# Patient Record
Sex: Female | Born: 1960 | Race: White | Hispanic: No | Marital: Married | Smoking: Former smoker
Health system: Southern US, Community
[De-identification: ages and names within clinical notes are randomized; demographics above are authoritative.]

## PROBLEM LIST (undated history)

## (undated) DIAGNOSIS — I10 Essential (primary) hypertension: Secondary | ICD-10-CM

## (undated) DIAGNOSIS — F419 Anxiety disorder, unspecified: Secondary | ICD-10-CM

## (undated) DIAGNOSIS — E785 Hyperlipidemia, unspecified: Secondary | ICD-10-CM

## (undated) HISTORY — DX: Essential (primary) hypertension: I10

## (undated) HISTORY — PX: TUBAL LIGATION: SHX77

## (undated) HISTORY — DX: Anxiety disorder, unspecified: F41.9

## (undated) HISTORY — DX: Hyperlipidemia, unspecified: E78.5

---

## 2006-09-22 ENCOUNTER — Ambulatory Visit: Payer: Self-pay | Admitting: Gastroenterology

## 2010-12-06 ENCOUNTER — Ambulatory Visit: Payer: Self-pay

## 2011-11-12 ENCOUNTER — Ambulatory Visit: Payer: Self-pay

## 2012-12-12 ENCOUNTER — Ambulatory Visit: Payer: Self-pay | Admitting: Family Medicine

## 2013-06-29 LAB — HM PAP SMEAR: HM Pap smear: NEGATIVE

## 2013-07-01 ENCOUNTER — Ambulatory Visit: Payer: Self-pay

## 2013-07-05 ENCOUNTER — Ambulatory Visit: Payer: Self-pay

## 2014-01-03 ENCOUNTER — Ambulatory Visit: Payer: Self-pay

## 2014-03-27 ENCOUNTER — Ambulatory Visit: Payer: Self-pay

## 2014-04-14 ENCOUNTER — Ambulatory Visit: Payer: Self-pay | Admitting: Family Medicine

## 2014-05-26 ENCOUNTER — Ambulatory Visit: Payer: Self-pay | Admitting: Internal Medicine

## 2014-12-01 ENCOUNTER — Other Ambulatory Visit: Payer: Self-pay | Admitting: Unknown Physician Specialty

## 2014-12-01 DIAGNOSIS — Z1231 Encounter for screening mammogram for malignant neoplasm of breast: Secondary | ICD-10-CM

## 2014-12-05 DIAGNOSIS — R7301 Impaired fasting glucose: Secondary | ICD-10-CM

## 2014-12-05 DIAGNOSIS — E785 Hyperlipidemia, unspecified: Secondary | ICD-10-CM | POA: Insufficient documentation

## 2014-12-05 DIAGNOSIS — I159 Secondary hypertension, unspecified: Secondary | ICD-10-CM

## 2014-12-05 DIAGNOSIS — F419 Anxiety disorder, unspecified: Secondary | ICD-10-CM

## 2014-12-05 DIAGNOSIS — I10 Essential (primary) hypertension: Secondary | ICD-10-CM | POA: Insufficient documentation

## 2014-12-06 ENCOUNTER — Encounter: Payer: Self-pay | Admitting: Unknown Physician Specialty

## 2014-12-06 ENCOUNTER — Ambulatory Visit
Admission: RE | Admit: 2014-12-06 | Discharge: 2014-12-06 | Disposition: A | Discharge status: Home / Self Care | Payer: 59 | Source: Ambulatory Visit | Attending: Unknown Physician Specialty | Admitting: Unknown Physician Specialty

## 2014-12-06 ENCOUNTER — Ambulatory Visit (INDEPENDENT_AMBULATORY_CARE_PROVIDER_SITE_OTHER): Payer: 59 | Admitting: Unknown Physician Specialty

## 2014-12-06 VITALS — BP 139/81 | HR 97 | Temp 98.6°F | Ht 63.5 in | Wt 204.6 lb

## 2014-12-06 DIAGNOSIS — Z1231 Encounter for screening mammogram for malignant neoplasm of breast: Secondary | ICD-10-CM | POA: Insufficient documentation

## 2014-12-06 DIAGNOSIS — I10 Essential (primary) hypertension: Secondary | ICD-10-CM

## 2014-12-06 DIAGNOSIS — E785 Hyperlipidemia, unspecified: Secondary | ICD-10-CM

## 2014-12-06 NOTE — Progress Notes (Signed)
No charge visit as not due to be seen.

## 2014-12-15 ENCOUNTER — Other Ambulatory Visit: Payer: Self-pay | Admitting: Unknown Physician Specialty

## 2014-12-21 ENCOUNTER — Ambulatory Visit
Admission: EM | Admit: 2014-12-21 | Discharge: 2014-12-21 | Disposition: A | Discharge status: Home / Self Care | Payer: 59 | Attending: Internal Medicine | Admitting: Internal Medicine

## 2014-12-21 ENCOUNTER — Encounter: Payer: Self-pay | Admitting: Emergency Medicine

## 2014-12-21 DIAGNOSIS — J40 Bronchitis, not specified as acute or chronic: Secondary | ICD-10-CM

## 2014-12-21 DIAGNOSIS — J019 Acute sinusitis, unspecified: Secondary | ICD-10-CM

## 2014-12-21 MED ORDER — TRIAMCINOLONE ACETONIDE 55 MCG/ACT NA AERO: 2.0000 | INHALATION_SPRAY | Freq: Every day | NASAL | Status: DC

## 2014-12-21 MED ORDER — AZITHROMYCIN 250 MG PO TABS
250.0000 mg | ORAL_TABLET | Freq: Every day | ORAL | Status: DC
Start: 2014-12-21 — End: 2015-04-13

## 2014-12-21 NOTE — Discharge Instructions (Signed)
Prescriptions for zithromax and nasacort were sent to the [facility] in [place]. Recheck or followup pcp/Cheryl Wicker NP if not improving in several days, and for new fever >100.5, increasing phlegm production. Cough may take 2-3 weeks to subside and resolve.

## 2014-12-21 NOTE — ED Notes (Signed)
Patient c/o sinus pain and pressure and nasal congestion, HAs for a week.  Patient denies fevers.

## 2014-12-21 NOTE — ED Provider Notes (Signed)
CSN: [id number]     Arrival date & time 12/21/14  1345 History   First MD Initiated Contact with Patient 12/21/14 1506     Chief Complaint  Patient presents with  . Nasal Congestion  . Facial Pain   HPI Patient is a 54 year old lady who presents with more than a week's history of sinus congestion, facial pressure, cough. Having difficulty sleeping at night, because of cough and congestion. Intermittent headache. Some runny nose. Initially some sore throat, now resolved. No nausea/vomiting/diarrhea. No fever. Little bit of achiness. No sick contacts. She's been taking Allegra-D, and NyQuil product, both of which make her jittery.   Past Medical History  Diagnosis Date  . Anxiety   . Hyperlipidemia   . Hypertension    Past Surgical History  Procedure Laterality Date  . Tubal ligation     Family History  Problem Relation Age of Onset  . Hypertension Mother   . Cancer Mother     breast and uterine  . Breast cancer Mother 57  . Hyperlipidemia Mother   . Hyperlipidemia Father   . Cancer Father     lung  . Hypertension Brother   . Hyperlipidemia Brother   . Cancer Maternal Grandmother     breast and skin  . Breast cancer Maternal Grandmother 60  . Hyperlipidemia Brother   . Hypertension Brother    History  Substance Use Topics  . Smoking status: Former Research scientist (life sciences)  . Smokeless tobacco: Never Used  . Alcohol Use: Yes     Comment: pt states 5 to 6 mixed drinks per week   OB History    No data available     Review of Systems  All other systems reviewed and are negative.   Allergies  Review of patient's allergies indicates no known allergies.  Home Medications   Prior to Admission medications   Medication Sig Start Date End Date Taking? Authorizing Provider  atorvastatin (LIPITOR) 20 MG tablet Take 20 mg by mouth daily.    Historical Provider, MD  azithromycin (ZITHROMAX) 250 MG tablet Take 1 tablet (250 mg total) by mouth daily. Take first 2 tablets together, then 1  every day until finished. 12/21/14   Sherlene Shams, MD  losartan (COZAAR) 50 MG tablet Take 1 tablet by mouth  daily 12/15/14   Valerie Roys, DO  triamcinolone (NASACORT AQ) 55 MCG/ACT AERO nasal inhaler Place 2 sprays into the nose daily. 12/21/14   Sherlene Shams, MD   BP 124/52 mmHg  Pulse 104  Temp(Src) 98.2 F (36.8 C) (Tympanic)  Resp 16  Ht 5\' 3"  (1.6 m)  Wt 200 lb (90.719 kg)  BMI 35.44 kg/m2  SpO2 97%  LMP  (LMP Unknown) Physical Exam  Constitutional: She is oriented to person, place, and time.  Alert, nicely groomed Looks tired. Leaning against the wall.  HENT:  Head: Atraumatic.  Bilateral TMs are moderately dull, no erythema Marked nasal congestion Throat is slightly red, with postnasal drainage  Eyes:  Conjugate gaze, no eye redness/drainage  Neck: Neck supple.  Cardiovascular: Normal rate and regular rhythm.   Pulmonary/Chest: No respiratory distress. She has no rales.  Coarse breath sounds throughout, symmetric breath sounds Deep breathing causes cough   Abdominal: She exhibits no distension.  Musculoskeletal: Normal range of motion.  No leg swelling  Neurological: She is alert and oriented to person, place, and time.  Skin: Skin is warm and dry.  No cyanosis  Nursing note and vitals reviewed.   ED  Course  Procedures   MDM   1. Acute sinusitis, recurrence not specified, unspecified location   2. Bronchitis    rx zithromax, nasacort.  Recheck or followup pcp/Cheryl Wicker for persistent/worsening sx's.    Sherlene Shams, MD 12/22/14 [phone]

## 2015-01-24 ENCOUNTER — Other Ambulatory Visit: Payer: Self-pay | Admitting: Unknown Physician Specialty

## 2015-03-27 ENCOUNTER — Ambulatory Visit: Payer: 59 | Admitting: Unknown Physician Specialty

## 2015-04-13 ENCOUNTER — Ambulatory Visit (INDEPENDENT_AMBULATORY_CARE_PROVIDER_SITE_OTHER): Payer: 59 | Admitting: Unknown Physician Specialty

## 2015-04-13 ENCOUNTER — Encounter: Payer: Self-pay | Admitting: Unknown Physician Specialty

## 2015-04-13 VITALS — BP 126/84 | HR 81 | Temp 97.9°F | Ht 63.5 in | Wt 208.0 lb

## 2015-04-13 DIAGNOSIS — R7301 Impaired fasting glucose: Secondary | ICD-10-CM

## 2015-04-13 DIAGNOSIS — E785 Hyperlipidemia, unspecified: Secondary | ICD-10-CM

## 2015-04-13 DIAGNOSIS — I159 Secondary hypertension, unspecified: Secondary | ICD-10-CM

## 2015-04-13 DIAGNOSIS — Z0000 Encounter for general adult medical examination without abnormal findings: Secondary | ICD-10-CM

## 2015-04-13 LAB — LIPID PANEL PICCOLO, WAIVED
CHOL/HDL RATIO PICCOLO,WAIVE: 3.8 mg/dL
CHOLESTEROL PICCOLO, WAIVED: 144 mg/dL (ref <200)
HDL CHOL PICCOLO, WAIVED: 38 mg/dL — AB (ref >59)
LDL Chol Calc Piccolo Waived: 75 mg/dL (ref <100)
TRIGLYCERIDES PICCOLO,WAIVED: 160 mg/dL — AB (ref <150)
VLDL Chol Calc Piccolo,Waive: 32 mg/dL — ABNORMAL HIGH (ref <30)

## 2015-04-13 LAB — MICROALBUMIN, URINE WAIVED
CREATININE, URINE WAIVED: 200 mg/dL (ref 10–300)
MICROALB, UR WAIVED: 30 mg/L — AB (ref 0–19)
Microalb/Creat Ratio: <30 mg/g (ref <30)

## 2015-04-13 MED ORDER — LOSARTAN POTASSIUM 50 MG PO TABS: 50.0000 mg | ORAL_TABLET | Freq: Every day | ORAL | Status: DC

## 2015-04-13 MED ORDER — ATORVASTATIN CALCIUM 20 MG PO TABS: 20.0000 mg | ORAL_TABLET | Freq: Every day | ORAL | Status: DC

## 2015-04-13 NOTE — Assessment & Plan Note (Signed)
Stable, continue present medications.   

## 2015-04-13 NOTE — Assessment & Plan Note (Signed)
.  Lipid panel reviewed, LDL is 76,.  Continue present meds

## 2015-04-13 NOTE — Progress Notes (Signed)
BP 126/84 mmHg  Pulse 81  Temp(Src) 97.9 F (36.6 C)  Ht 5' 3.5" (1.613 m)  Wt 208 lb (94.348 kg)  BMI 36.26 kg/m2  SpO2 94%  LMP  (LMP Unknown)   Subjective:    Patient ID: Lorraine Melendez, female    DOB: 08-19-1960, 54 y.o.   MRN: [id number]  HPI: Lorraine Melendez is a 54 y.o. female  Chief Complaint  Patient presents with  . Hyperlipidemia  . Hypertension   Hypertension Using medications without difficulty Average home BPs   No problems or lightheadedness No chest pain with exertion or shortness of breath No Edema   Hyperlipidemia Using medications without problems: No Muscle aches: none Diet compliance: Exercise:    Relevant past medical, surgical, family and social history reviewed and updated as indicated. Interim medical history since our last visit reviewed. Allergies and medications reviewed and updated.  Review of Systems  Per HPI unless specifically indicated above     Objective:    BP 126/84 mmHg  Pulse 81  Temp(Src) 97.9 F (36.6 C)  Ht 5' 3.5" (1.613 m)  Wt 208 lb (94.348 kg)  BMI 36.26 kg/m2  SpO2 94%  LMP  (LMP Unknown)  Wt Readings from Last 3 Encounters:  04/13/15 208 lb (94.348 kg)  12/21/14 200 lb (90.719 kg)  12/06/14 204 lb 9.6 oz (92.806 kg)    Physical Exam  Constitutional: She is oriented to person, place, and time. She appears well-developed and well-nourished. No distress.  HENT:  Head: Normocephalic and atraumatic.  Eyes: Conjunctivae and lids are normal. Right eye exhibits no discharge. Left eye exhibits no discharge. No scleral icterus.  Cardiovascular: Normal rate, regular rhythm and normal heart sounds.   Pulmonary/Chest: Effort normal and breath sounds normal. No respiratory distress.  Abdominal: Normal appearance. There is no splenomegaly or hepatomegaly.  Musculoskeletal: Normal range of motion.  Neurological: She is alert and oriented to person, place, and time.  Skin: Skin is intact. No rash noted. No  pallor.  Psychiatric: She has a normal mood and affect. Her behavior is normal. Judgment and thought content normal.     Assessment & Plan:   Problem List Items Addressed This Visit      Unprioritized   Impaired fasting glucose    States labs done at [facility] showed Hgb A1C of less than 5      Hyperlipidemia - Primary    .Lipid panel reviewed, LDL is 76,.  Continue present meds      Relevant Medications   atorvastatin (LIPITOR) 20 MG tablet   losartan (COZAAR) 50 MG tablet   Other Relevant Orders   Microalbumin, Urine Waived   Uric acid   Lipid Panel Piccolo, Waived   Comprehensive metabolic panel   Hypertension    Stable, continue present medications.       Relevant Medications   atorvastatin (LIPITOR) 20 MG tablet   losartan (COZAAR) 50 MG tablet   Other Relevant Orders   Microalbumin, Urine Waived   Uric acid   Lipid Panel Piccolo, Waived   Comprehensive metabolic panel    Other Visit Diagnoses    Health care maintenance        Relevant Orders    Microalbumin, Urine Waived    Uric acid    Lipid Panel Piccolo, Waived    Comprehensive metabolic panel    HIV antibody    Hepatitis C antibody        Follow up plan: Return in about 6 months (  around 10/11/2015) for physical.

## 2015-04-13 NOTE — Assessment & Plan Note (Signed)
States labs done at [facility] showed Hgb A1C of less than 5

## 2015-04-14 LAB — COMPREHENSIVE METABOLIC PANEL
ALK PHOS: 80 IU/L (ref 39–117)
ALT: 35 IU/L — ABNORMAL HIGH (ref 0–32)
AST: 22 IU/L (ref 0–40)
Albumin/Globulin Ratio: 1.9 (ref 1.1–2.5)
Albumin: 4.1 g/dL (ref 3.5–5.5)
BUN/Creatinine Ratio: 18 (ref 9–23)
BUN: 13 mg/dL (ref 6–24)
Bilirubin Total: 0.5 mg/dL (ref 0.0–1.2)
CO2: 24 mmol/L (ref 18–29)
Calcium: 9 mg/dL (ref 8.7–10.2)
Chloride: 101 mmol/L (ref 97–106)
Creatinine, Ser: 0.72 mg/dL (ref 0.57–1.00)
GFR calc Af Amer: 109 mL/min/{1.73_m2} (ref >59)
GFR calc non Af Amer: 95 mL/min/{1.73_m2} (ref >59)
GLOBULIN, TOTAL: 2.2 g/dL (ref 1.5–4.5)
Glucose: 97 mg/dL (ref 65–99)
POTASSIUM: 4.4 mmol/L (ref 3.5–5.2)
SODIUM: 143 mmol/L (ref 136–144)
Total Protein: 6.3 g/dL (ref 6.0–8.5)

## 2015-04-14 LAB — HIV ANTIBODY (ROUTINE TESTING W REFLEX): HIV SCREEN 4TH GENERATION: NONREACTIVE

## 2015-04-14 LAB — URIC ACID: Uric Acid: 5 mg/dL (ref 2.5–7.1)

## 2015-04-14 LAB — HEPATITIS C ANTIBODY

## 2015-04-16 ENCOUNTER — Encounter: Payer: Self-pay | Admitting: Unknown Physician Specialty

## 2015-04-16 NOTE — Progress Notes (Signed)
Quick Note:  Normal labs. Patient notified by letter. ______ 

## 2015-08-21 ENCOUNTER — Encounter: Payer: 59 | Admitting: Unknown Physician Specialty

## 2015-10-03 ENCOUNTER — Ambulatory Visit (INDEPENDENT_AMBULATORY_CARE_PROVIDER_SITE_OTHER): Payer: 59 | Admitting: Unknown Physician Specialty

## 2015-10-03 ENCOUNTER — Encounter: Payer: Self-pay | Admitting: Unknown Physician Specialty

## 2015-10-03 VITALS — BP 115/78 | HR 73 | Temp 97.7°F | Ht 63.2 in | Wt 199.6 lb

## 2015-10-03 DIAGNOSIS — Z0000 Encounter for general adult medical examination without abnormal findings: Secondary | ICD-10-CM

## 2015-10-03 DIAGNOSIS — E785 Hyperlipidemia, unspecified: Secondary | ICD-10-CM

## 2015-10-03 DIAGNOSIS — I10 Essential (primary) hypertension: Secondary | ICD-10-CM

## 2015-10-03 LAB — BAYER DCA HB A1C WAIVED: HB A1C: 5.7 % (ref <7.0)

## 2015-10-03 LAB — LIPID PANEL PICCOLO, WAIVED
CHOL/HDL RATIO PICCOLO,WAIVE: 4.7 mg/dL
CHOLESTEROL PICCOLO, WAIVED: 138 mg/dL (ref <200)
HDL CHOL PICCOLO, WAIVED: 29 mg/dL — AB (ref >59)
LDL CHOL CALC PICCOLO WAIVED: 77 mg/dL (ref <100)
TRIGLYCERIDES PICCOLO,WAIVED: 159 mg/dL — AB (ref <150)
VLDL CHOL CALC PICCOLO,WAIVE: 32 mg/dL — AB (ref <30)

## 2015-10-03 MED ORDER — ATORVASTATIN CALCIUM 20 MG PO TABS: 20.0000 mg | ORAL_TABLET | Freq: Every day | ORAL | Status: DC

## 2015-10-03 MED ORDER — LOSARTAN POTASSIUM 50 MG PO TABS: 50.0000 mg | ORAL_TABLET | Freq: Every day | ORAL | Status: DC

## 2015-10-03 NOTE — Patient Instructions (Signed)
Please do call to schedule your mammogram; the number to schedule one at either [facility] or [facility] is [phone]   

## 2015-10-03 NOTE — Assessment & Plan Note (Signed)
Stable, continue present medications.   

## 2015-10-03 NOTE — Assessment & Plan Note (Signed)
Lipid panel ordered. Results pending.

## 2015-10-03 NOTE — Progress Notes (Signed)
BP 115/78 mmHg  Pulse 73  Temp(Src) 97.7 F (36.5 C)  Ht 5' 3.2" (1.605 m)  Wt 199 lb 9.6 oz (90.538 kg)  BMI 35.15 kg/m2  SpO2 96%  LMP  (LMP Unknown)   Subjective:    Patient ID: Lorraine Melendez, female    DOB: [birth year], 55 y.o.   MRN: [id number]  HPI: Lorraine Melendez is a 55 y.o. female  Chief Complaint  Patient presents with  . Annual Exam    Relevant past medical, surgical, family and social history reviewed and updated as indicated. Interim medical history since our last visit reviewed. Allergies and medications reviewed and updated.  Hypertension Using medications without difficulty: may miss a dose once or twice a month Average home BPs: doesn't take No problems or lightheadedness No chest pain with exertion or shortness of breath No Edema  Hyperlipidemia Using medications without problems: may miss a dose once or twice a month No Muscle aches  Diet compliance: mostly eats at home. Goes through "peaks and valleys" with eating healthy. Tries to eat a lot of salads, vegetables, and fruits Exercise: Walks at work   Review of Systems  Constitutional: Negative.   HENT: Negative.   Eyes: Negative.   Respiratory: Negative.   Cardiovascular: Negative.   Gastrointestinal: Negative.   Endocrine: Negative.   Genitourinary: Negative.   Musculoskeletal: Negative.   Skin: Negative.   Allergic/Immunologic: Negative.   Neurological: Negative.   Hematological: Negative.   Psychiatric/Behavioral: Negative.     Per HPI unless specifically indicated above     Objective:    BP 115/78 mmHg  Pulse 73  Temp(Src) 97.7 F (36.5 C)  Ht 5' 3.2" (1.605 m)  Wt 199 lb 9.6 oz (90.538 kg)  BMI 35.15 kg/m2  SpO2 96%  LMP  (LMP Unknown)  Wt Readings from Last 3 Encounters:  10/03/15 199 lb 9.6 oz (90.538 kg)  04/13/15 208 lb (94.348 kg)  12/21/14 200 lb (90.719 kg)    Physical Exam  Constitutional: She is oriented to person, place, and time. She appears  well-developed and well-nourished.  HENT:  Head: Normocephalic and atraumatic.  Eyes: Pupils are equal, round, and reactive to light. Right eye exhibits no discharge. Left eye exhibits no discharge. No scleral icterus.  Neck: Normal range of motion. Neck supple. Carotid bruit is not present. No thyromegaly present.  Cardiovascular: Normal rate, regular rhythm and normal heart sounds.  Exam reveals no gallop and no friction rub.   No murmur heard. Pulmonary/Chest: Effort normal and breath sounds normal. No respiratory distress. She has no wheezes. She has no rales.  Abdominal: Soft. Bowel sounds are normal. There is no tenderness. There is no rebound.  Genitourinary: No breast swelling, tenderness or discharge.  Musculoskeletal: Normal range of motion.  Lymphadenopathy:    She has no cervical adenopathy.  Neurological: She is alert and oriented to person, place, and time.  Skin: Skin is warm, dry and intact. No rash noted.  Psychiatric: She has a normal mood and affect. Her speech is normal and behavior is normal. Judgment and thought content normal. Cognition and memory are normal.    Results for orders placed or performed in visit on 09/27/15  HM PAP SMEAR  Result Value Ref Range   HM Pap smear from [facility]- negative PAP and negative HPV       Assessment & Plan:   Problem List Items Addressed This Visit      Unprioritized   Hyperlipidemia    Lipid  panel ordered. Results pending.       Relevant Medications   atorvastatin (LIPITOR) 20 MG tablet   losartan (COZAAR) 50 MG tablet   Other Relevant Orders   Lipid Panel Piccolo, Waived   Hypertension    Stable, continue present medications.        Relevant Medications   atorvastatin (LIPITOR) 20 MG tablet   losartan (COZAAR) 50 MG tablet   Other Relevant Orders   Comprehensive metabolic panel    Other Visit Diagnoses    Annual physical exam    -  Primary    Relevant Orders    Bayer DCA Hb A1c Waived    TSH    CBC    MM  DIGITAL SCREENING BILATERAL        Follow up plan: Return in about 6 months (around 04/03/2016).

## 2015-10-04 LAB — COMPREHENSIVE METABOLIC PANEL
ALBUMIN: 4.4 g/dL (ref 3.5–5.5)
ALT: 34 IU/L — ABNORMAL HIGH (ref 0–32)
AST: 20 IU/L (ref 0–40)
Albumin/Globulin Ratio: 1.8 (ref 1.2–2.2)
Alkaline Phosphatase: 86 IU/L (ref 39–117)
BUN / CREAT RATIO: 14 (ref 9–23)
BUN: 11 mg/dL (ref 6–24)
Bilirubin Total: 0.5 mg/dL (ref 0.0–1.2)
CO2: 22 mmol/L (ref 18–29)
CREATININE: 0.77 mg/dL (ref 0.57–1.00)
Calcium: 9.3 mg/dL (ref 8.7–10.2)
Chloride: 101 mmol/L (ref 96–106)
GFR, EST AFRICAN AMERICAN: 101 mL/min/{1.73_m2} (ref >59)
GFR, EST NON AFRICAN AMERICAN: 87 mL/min/{1.73_m2} (ref >59)
GLUCOSE: 91 mg/dL (ref 65–99)
Globulin, Total: 2.4 g/dL (ref 1.5–4.5)
Potassium: 4.4 mmol/L (ref 3.5–5.2)
Sodium: 142 mmol/L (ref 134–144)
TOTAL PROTEIN: 6.8 g/dL (ref 6.0–8.5)

## 2015-10-04 LAB — CBC
HEMATOCRIT: 41.3 % (ref 34.0–46.6)
HEMOGLOBIN: 13.9 g/dL (ref 11.1–15.9)
MCH: 30.3 pg (ref 26.6–33.0)
MCHC: 33.7 g/dL (ref 31.5–35.7)
MCV: 90 fL (ref 79–97)
Platelets: 260 10*3/uL (ref 150–379)
RBC: 4.59 x10E6/uL (ref 3.77–5.28)
RDW: 13.2 % (ref 12.3–15.4)
WBC: 5.8 10*3/uL (ref 3.4–10.8)

## 2015-10-04 LAB — TSH: TSH: 2.68 u[IU]/mL (ref 0.450–4.500)

## 2015-10-05 ENCOUNTER — Encounter: Payer: Self-pay | Admitting: Unknown Physician Specialty

## 2015-10-05 NOTE — Progress Notes (Signed)
Quick Note:  Normal labs. Patient notified by letter. ______ 

## 2015-10-11 ENCOUNTER — Ambulatory Visit
Admission: EM | Admit: 2015-10-11 | Discharge: 2015-10-11 | Disposition: A | Discharge status: Home / Self Care | Payer: 59 | Attending: Family Medicine | Admitting: Family Medicine

## 2015-10-11 ENCOUNTER — Encounter: Payer: Self-pay | Admitting: Emergency Medicine

## 2015-10-11 DIAGNOSIS — R42 Dizziness and giddiness: Secondary | ICD-10-CM

## 2015-10-11 MED ORDER — ONDANSETRON 8 MG PO TBDP: 8.0000 mg | ORAL_TABLET | Freq: Three times a day (TID) | ORAL | Status: DC | PRN

## 2015-10-11 MED ORDER — MECLIZINE HCL 25 MG PO TABS: 25.0000 mg | ORAL_TABLET | Freq: Three times a day (TID) | ORAL | Status: DC | PRN

## 2015-10-11 NOTE — Discharge Instructions (Signed)
Dizziness Dizziness is a common problem. It makes you feel unsteady or lightheaded. You may feel like you are about to pass out (faint). Dizziness can lead to injury if you stumble or fall. Anyone can get dizzy, but dizziness is more common in older adults. This condition can be caused by a number of things, including:  Medicines.  Dehydration.  Illness. HOME CARE Following these instructions may help with your condition: Eating and Drinking  Drink enough fluid to keep your pee (urine) clear or pale yellow. This helps to keep you from getting dehydrated. Try to drink more clear fluids, such as water.  Do not drink alcohol.  Limit how much caffeine you drink or eat if told by your doctor.  Limit how much salt you drink or eat if told by your doctor. Activity  Avoid making quick movements.  When you stand up from sitting in a chair, steady yourself until you feel okay.  In the morning, first sit up on the side of the bed. When you feel okay, stand slowly while you hold onto something. Do this until you know that your balance is fine.  Move your legs often if you need to stand in one place for a long time. Tighten and relax your muscles in your legs while you are standing.  Do not drive or use heavy machinery if you feel dizzy.  Avoid bending down if you feel dizzy. Place items in your home so that they are easy for you to reach without leaning over. Lifestyle  Do not use any tobacco products, including cigarettes, chewing tobacco, or electronic cigarettes. If you need help quitting, ask your doctor.  Try to lower your stress level, such as with yoga or meditation. Talk with your doctor if you need help. General Instructions  Watch your dizziness for any changes.  Take medicines only as told by your doctor. Talk with your doctor if you think that your dizziness is caused by a medicine that you are taking.  Tell a friend or a family member that you are feeling dizzy. If he or  she notices any changes in your behavior, have this person call your doctor.  Keep all follow-up visits as told by your doctor. This is important. GET HELP IF:  Your dizziness does not go away.  Your dizziness or light-headedness gets worse.  You feel sick to your stomach (nauseous).  You have trouble hearing.  You have new symptoms.  You are unsteady on your feet or you feel like the room is spinning. GET HELP RIGHT AWAY IF:  You throw up (vomit) or have diarrhea and are unable to eat or drink anything.  You have trouble:  Talking.  Walking.  Swallowing.  Using your arms, hands, or legs.  You feel generally weak.  You are not thinking clearly or you have trouble forming sentences. It may take a friend or family member to notice this.  You have:  Chest pain.  Pain in your belly (abdomen).  Shortness of breath.  Sweating.  Your vision changes.  You are bleeding.  You have a headache.  You have neck pain or a stiff neck.  You have a fever.   This information is not intended to replace advice given to you by your health care provider. Make sure you discuss any questions you have with your health care provider.   Document Released: 05/15/2011 Document Revised: 10/10/2014 Document Reviewed: 05/22/2014 Elsevier Interactive Patient Education 2016 Else[employer]  Vertigo Vertigo means that  you feel like you are moving when you are not. Vertigo can also make you feel like things around you are moving when they are not. This feeling can come and go at any time. Vertigo often goes away on its own. HOME CARE  Avoid making fast movements.  Avoid driving.  Avoid using heavy machinery.  Avoid doing any task or activity that might cause danger to you or other people if you would have a vertigo attack while you are doing it.  Sit down right away if you feel dizzy or have trouble with your balance.  Take over-the-counter and prescription medicines only as told  by your doctor.  Follow instructions from your doctor about which positions or movements you should avoid.  Drink enough fluid to keep your pee (urine) clear or pale yellow.  Keep all follow-up visits as told by your doctor. This is important. GET HELP IF:  Medicine does not help your vertigo.  You have a fever.  Your problems get worse or you have new symptoms.  Your family or friends see changes in your behavior.  You feel sick to your stomach (nauseous) or you throw up (vomit).  You have a "pins and needles" feeling or you are numb in part of your body. GET HELP RIGHT AWAY IF:  You have trouble moving or talking.  You are always dizzy.  You pass out (faint).  You get very bad headaches.  You feel weak or have trouble using your hands, arms, or legs.  You have changes in your hearing.  You have changes in your seeing (vision).  You get a stiff neck.  Bright light starts to bother you.   This information is not intended to replace advice given to you by your health care provider. Make sure you discuss any questions you have with your health care provider.   Document Released: 03/04/2008 Document Revised: 02/14/2015 Document Reviewed: 09/18/2014 Elsevier Interactive Patient Education [employer].

## 2015-10-11 NOTE — ED Provider Notes (Signed)
CSN: [id number]     Arrival date & time 10/11/15  1315 History   First MD Initiated Contact with Patient 10/11/15 1413    Nurses notes were reviewed. Chief Complaint  Patient presents with  . Dizziness   Patient is here because of dizziness. She states about 3:00 this morning she woke up with the bathroom and she is going to the bathroom became lightheaded and dizzy. The dizziness occurs mostly when she is standing and when she first gets up. Sometimes she will have dizziness almost at times she is fine if she sitting and laying down such as doesn't move suddenly. She doesn't have any problems with tilting her head or moving her head causing dizziness.  Supports last surgery was tubal ligation about 20 years ago she has hypertension hyperlipidemia and history of anxiety disorder. She's had yearly exam recently in the last 2-3 weeks and extensive blood work which she states all was normal. Mother had hypertension and had breast and uterine cancer. Father had hyperlipidemia as well as her brother and mother also had hypertension. She is a former smoker.   (Consider location/radiation/quality/duration/timing/severity/associated sxs/prior Treatment) Patient is a 55 y.o. female presenting with dizziness. The history is provided by the patient. No language interpreter was used.  Dizziness Quality:  Vertigo Severity:  Moderate Onset quality:  Sudden Duration:  12 hours Timing:  Constant Progression:  Partially resolved Chronicity:  New Context: medication and standing up   Context: not when bending over, not with bowel movement, not with ear pain, not with head movement, not with inactivity and not with loss of consciousness   Relieved by:  Nothing Ineffective treatments:  None tried Associated symptoms: no chest pain, no headaches, no hearing loss, no nausea, no palpitations, no shortness of breath, no tinnitus, no vision changes, no vomiting and no weakness   Risk factors: no anemia, no heart  disease, no hx of stroke, no hx of vertigo, no Meniere's disease, no multiple medications and no new medications     Past Medical History  Diagnosis Date  . Anxiety   . Hyperlipidemia   . Hypertension    Past Surgical History  Procedure Laterality Date  . Tubal ligation     Family History  Problem Relation Age of Onset  . Hypertension Mother   . Cancer Mother     breast and uterine  . Breast cancer Mother 24  . Hyperlipidemia Mother   . Hyperlipidemia Father   . Cancer Father     lung  . Hypertension Brother   . Hyperlipidemia Brother   . Cancer Maternal Grandmother     breast and skin  . Breast cancer Maternal Grandmother 60  . Hyperlipidemia Brother   . Hypertension Brother    Social History  Substance Use Topics  . Smoking status: Former Research scientist (life sciences)  . Smokeless tobacco: Never Used  . Alcohol Use: Yes     Comment: pt states 5 to 6 mixed drinks per week   OB History    No data available     Review of Systems  HENT: Negative for hearing loss and tinnitus.   Respiratory: Negative for shortness of breath.   Cardiovascular: Negative for chest pain and palpitations.  Gastrointestinal: Negative for nausea and vomiting.  Neurological: Positive for dizziness. Negative for weakness and headaches.  All other systems reviewed and are negative.   Allergies  Review of patient's allergies indicates no known allergies.  Home Medications   Prior to Admission medications   Medication Sig  Start Date End Date Taking? Authorizing Provider  atorvastatin (LIPITOR) 20 MG tablet Take 1 tablet (20 mg total) by mouth daily at 6 PM. 10/03/15   Kathrine Haddock, NP  losartan (COZAAR) 50 MG tablet Take 1 tablet (50 mg total) by mouth daily. 10/03/15   Kathrine Haddock, NP  meclizine (ANTIVERT) 25 MG tablet Take 1 tablet (25 mg total) by mouth 3 (three) times daily as needed for dizziness. 10/11/15   Frederich Cha, MD  ondansetron (ZOFRAN ODT) 8 MG disintegrating tablet Take 1 tablet (8 mg total) by  mouth every 8 (eight) hours as needed for nausea or vomiting. 10/11/15   Frederich Cha, MD   Meds Ordered and Administered this Visit  Medications - No data to display  BP 134/71 mmHg  Pulse 90  Temp(Src) 97.5 F (36.4 C) (Oral)  Resp 20  Ht 5\' 3"  (1.6 m)  Wt 198 lb (89.812 kg)  BMI 35.08 kg/m2  SpO2 99%  LMP  (LMP Unknown) Orthostatic VS for the past 24 hrs:  BP- Lying Pulse- Lying BP- Sitting Pulse- Sitting BP- Standing at 0 minutes Pulse- Standing at 0 minutes  10/11/15 1427 123/57 mmHg 93 130/62 mmHg 95 129/73 mmHg 97    Physical Exam  Constitutional: She is oriented to person, place, and time. She appears well-developed and well-nourished.  HENT:  Head: Normocephalic and atraumatic.  Right Ear: External ear normal.  Left Ear: External ear normal.  Mouth/Throat: Oropharynx is clear and moist. No oropharyngeal exudate.  Eyes: Conjunctivae are normal. Pupils are equal, round, and reactive to light.  Neck: Normal range of motion. Neck supple. No thyromegaly present.  Cardiovascular: Normal rate, regular rhythm and normal heart sounds.   Pulmonary/Chest: Effort normal and breath sounds normal.  Musculoskeletal: Normal range of motion.  Lymphadenopathy:    She has no cervical adenopathy.  Neurological: She is alert and oriented to person, place, and time.  Skin: Skin is warm and dry.  Psychiatric: She has a normal mood and affect.  Vitals reviewed.   ED Course  Procedures (including critical care time)  Labs Review Labs Reviewed - No data to display  Imaging Review No results found.   Visual Acuity Review  Right Eye Distance:   Left Eye Distance:   Bilateral Distance:    Right Eye Near:   Left Eye Near:    Bilateral Near:      Patient social signs of orthostatic changes. Attempts to do Epley's maneuver were unsuccessful as far as making a change in her dizziness.   MDM   1. Vertigo    With the negative Apley's maneuver and orthostatic changes will treat  conservatively. Patient does have extensive blood work about 2 weeks ago will not repeat that at this time will treat the vertigo with Antivert 25 mg 1 tablet every 8 hours when necessary basis Zofran 8 mg sublingually every 8 hours when necessary we gave her a work note for today and tomorrow. If problem persist she might need further evaluation at this point time will just treat conservatively since no signs of life-threatening illness and disease present.    Note: This dictation was prepared with Dragon dictation along with smaller phrase technology. Any transcriptional errors that result from this process are unintentional.  Frederich Cha, MD 10/11/15 1455

## 2015-10-11 NOTE — ED Notes (Signed)
Pt presents with steady gait to tx room today c/o dizziness that started last night, worse when she has her eyes closed. Pt denies any chest pain or shortness of breath at this time. No acute distress noted.

## 2016-04-04 ENCOUNTER — Ambulatory Visit (INDEPENDENT_AMBULATORY_CARE_PROVIDER_SITE_OTHER): Payer: 59 | Admitting: Unknown Physician Specialty

## 2016-04-04 ENCOUNTER — Encounter: Payer: Self-pay | Admitting: Unknown Physician Specialty

## 2016-04-04 DIAGNOSIS — N898 Other specified noninflammatory disorders of vagina: Secondary | ICD-10-CM | POA: Insufficient documentation

## 2016-04-04 DIAGNOSIS — I10 Essential (primary) hypertension: Secondary | ICD-10-CM | POA: Diagnosis not present

## 2016-04-04 DIAGNOSIS — L298 Other pruritus: Secondary | ICD-10-CM | POA: Diagnosis not present

## 2016-04-04 MED ORDER — LOSARTAN POTASSIUM 50 MG PO TABS: 50.0000 mg | ORAL_TABLET | Freq: Every day | ORAL | 1 refills | Status: DC

## 2016-04-04 MED ORDER — ATORVASTATIN CALCIUM 20 MG PO TABS: 20.0000 mg | ORAL_TABLET | Freq: Every day | ORAL | 1 refills | Status: DC

## 2016-04-04 MED ORDER — FLUCONAZOLE 150 MG PO TABS: 150.0000 mg | ORAL_TABLET | Freq: Every day | ORAL | 0 refills | Status: DC

## 2016-04-04 NOTE — Progress Notes (Signed)
BP 115/78 (BP Location: Left Arm, Patient Position: Sitting, Cuff Size: Large)   Pulse 71   Temp 97.8 F (36.6 C)   Ht 5' 4.2" (1.631 m) Comment: pt had shoes on  Wt 198 lb (89.8 kg) Comment: pt had shoes on  LMP  (LMP Unknown)   SpO2 95%   BMI 33.78 kg/m    Subjective:    Patient ID: Lorraine Melendez, female    DOB: 08/02/1960, 55 y.o.   MRN: [id number]  HPI: Lorraine Melendez is a 55 y.o. female  Chief Complaint  Patient presents with  . Hyperlipidemia  . Hypertension   Hypertension Using medications without difficulty Average home BPs   No problems or lightheadedness No chest pain with exertion or shortness of breath No Edema   Hyperlipidemia Using medications without problems: No Muscle aches  Diet compliance:none Exercise:none  Yeast: Pt states she has vaginal itching seems like "all the time"     Relevant past medical, surgical, family and social history reviewed and updated as indicated. Interim medical history since our last visit reviewed. Allergies and medications reviewed and updated.  Review of Systems  Per HPI unless specifically indicated above     Objective:    BP 115/78 (BP Location: Left Arm, Patient Position: Sitting, Cuff Size: Large)   Pulse 71   Temp 97.8 F (36.6 C)   Ht 5' 4.2" (1.631 m) Comment: pt had shoes on  Wt 198 lb (89.8 kg) Comment: pt had shoes on  LMP  (LMP Unknown)   SpO2 95%   BMI 33.78 kg/m   Wt Readings from Last 3 Encounters:  04/04/16 198 lb (89.8 kg)  10/11/15 198 lb (89.8 kg)  10/03/15 199 lb 9.6 oz (90.5 kg)    Physical Exam  Constitutional: She is oriented to person, place, and time. She appears well-developed and well-nourished. No distress.  HENT:  Head: Normocephalic and atraumatic.  Eyes: Conjunctivae and lids are normal. Right eye exhibits no discharge. Left eye exhibits no discharge. No scleral icterus.  Neck: Normal range of motion. Neck supple. No JVD present. Carotid bruit is not present.    Cardiovascular: Normal rate, regular rhythm and normal heart sounds.   Pulmonary/Chest: Effort normal and breath sounds normal.  Abdominal: Normal appearance. There is no splenomegaly or hepatomegaly.  Musculoskeletal: Normal range of motion.  Neurological: She is alert and oriented to person, place, and time.  Skin: Skin is warm, dry and intact. No rash noted. No pallor.  Psychiatric: She has a normal mood and affect. Her behavior is normal. Judgment and thought content normal.    Results for orders placed or performed in visit on 10/03/15  Lipid Panel [place], [employer]  Result Value Ref Range   Cholesterol Piccolo, Waived 138 <200 mg/dL   HDL Chol Piccolo, Waived 29 (L) >59 mg/dL   Triglycerides Piccolo,Waived 159 (H) <150 mg/dL   Chol/HDL Ratio Piccolo,Waive 4.7 mg/dL   LDL Chol Calc Piccolo Waived 77 <100 mg/dL   VLDL Chol Calc Piccolo,Waive 32 (H) <30 mg/dL  Bayer DCA Hb A1c Waived  Result Value Ref Range   Bayer DCA Hb A1c Waived 5.7 <7.0 %  Comprehensive metabolic panel  Result Value Ref Range   Glucose 91 65 - 99 mg/dL   BUN 11 6 - 24 mg/dL   Creatinine, Ser 0.77 0.57 - 1.00 mg/dL   GFR calc non Af Amer 87 >59 mL/min/1.73   GFR calc Af Amer 101 >59 mL/min/1.73   BUN/Creatinine Ratio 14  9 - 23   Sodium 142 134 - 144 mmol/L   Potassium 4.4 3.5 - 5.2 mmol/L   Chloride 101 96 - 106 mmol/L   CO2 22 18 - 29 mmol/L   Calcium 9.3 8.7 - 10.2 mg/dL   Total Protein 6.8 6.0 - 8.5 g/dL   Albumin 4.4 3.5 - 5.5 g/dL   Globulin, Total 2.4 1.5 - 4.5 g/dL   Albumin/Globulin Ratio 1.8 1.2 - 2.2   Bilirubin Total 0.5 0.0 - 1.2 mg/dL   Alkaline Phosphatase 86 39 - 117 IU/L   AST 20 0 - 40 IU/L   ALT 34 (H) 0 - 32 IU/L  TSH  Result Value Ref Range   TSH 2.680 0.450 - 4.500 uIU/mL  CBC  Result Value Ref Range   WBC 5.8 3.4 - 10.8 x10E3/uL   RBC 4.59 3.77 - 5.28 x10E6/uL   Hemoglobin 13.9 11.1 - 15.9 g/dL   Hematocrit 41.3 34.0 - 46.6 %   MCV 90 79 - 97 fL   MCH 30.3 26.6 - 33.0  pg   MCHC 33.7 31.5 - 35.7 g/dL   RDW 13.2 12.3 - 15.4 %   Platelets 260 150 - 379 x10E3/uL      Assessment & Plan:   Problem List Items Addressed This Visit      Unprioritized   Hypertension    Stable, continue present medications.        Relevant Medications   atorvastatin (LIPITOR) 20 MG tablet   losartan (COZAAR) 50 MG tablet   Vaginal itching    Pt gets relief from OTC meds.  Will give Diflucan monthly to see if it helps.  Schedule PE for more detailed exam       Other Visit Diagnoses   None.      Follow up plan: Return for physical.

## 2016-04-04 NOTE — Assessment & Plan Note (Signed)
Pt gets relief from OTC meds.  Will give Diflucan monthly to see if it helps.  Schedule PE for more detailed exam

## 2016-04-04 NOTE — Assessment & Plan Note (Signed)
Stable, continue present medications.   

## 2016-07-11 ENCOUNTER — Encounter: Payer: Self-pay | Admitting: Unknown Physician Specialty

## 2016-07-11 ENCOUNTER — Ambulatory Visit (INDEPENDENT_AMBULATORY_CARE_PROVIDER_SITE_OTHER): Payer: 59 | Admitting: Unknown Physician Specialty

## 2016-07-11 VITALS — BP 119/76 | HR 88 | Temp 97.5°F | Wt 199.6 lb

## 2016-07-11 DIAGNOSIS — N904 Leukoplakia of vulva: Secondary | ICD-10-CM

## 2016-07-11 DIAGNOSIS — L298 Other pruritus: Secondary | ICD-10-CM

## 2016-07-11 DIAGNOSIS — N898 Other specified noninflammatory disorders of vagina: Secondary | ICD-10-CM

## 2016-07-11 LAB — WET PREP FOR TRICH, YEAST, CLUE
CLUE CELL EXAM: NEGATIVE
TRICHOMONAS EXAM: NEGATIVE
YEAST EXAM: POSITIVE — AB

## 2016-07-11 MED ORDER — AMITRIPTYLINE HCL 10 MG PO TABS: 10.0000 mg | ORAL_TABLET | Freq: Every day | ORAL | 0 refills | Status: DC

## 2016-07-11 MED ORDER — CLOBETASOL PROPIONATE 0.05 % EX OINT: 1.0000 "application " | TOPICAL_OINTMENT | Freq: Two times a day (BID) | CUTANEOUS | 0 refills | Status: DC

## 2016-07-11 MED ORDER — FLUCONAZOLE 150 MG PO TABS: 150.0000 mg | ORAL_TABLET | Freq: Once | ORAL | 0 refills | Status: AC

## 2016-07-11 MED ORDER — VALACYCLOVIR HCL 500 MG PO TABS: 500.0000 mg | ORAL_TABLET | Freq: Two times a day (BID) | ORAL | 0 refills | Status: DC

## 2016-07-11 NOTE — Assessment & Plan Note (Addendum)
Diffuse whitening of the perineum typical of lichen sclerosus. Wet prep positive for yeast. Prescribed clobetasol ointment and amitriptyline for lichen sclerosus. Instructed patient to take another diflucan tablet for yeast. Valtrex x 10 days also prescribed due to history of genital herpes.

## 2016-07-11 NOTE — Assessment & Plan Note (Signed)
New diagnosis.  Will recheck in 1 months

## 2016-07-11 NOTE — Progress Notes (Signed)
BP 119/76 (BP Location: Left Arm, Cuff Size: Large)   Pulse 88   Temp 97.5 F (36.4 C)   Wt 199 lb 9.6 oz (90.5 kg)   LMP  (LMP Unknown)   SpO2 98%   BMI 34.05 kg/m    Subjective:    Patient ID: Lorraine Melendez, female    DOB: 1961-01-20, 56 y.o.   MRN: [id number]  HPI: Lorraine Melendez is a 56 y.o. female  Chief Complaint  Patient presents with  . Vaginal Itching    pt states she has had vaginal itching for about 4 to 5 months now, states diflucan is not helping and the itching is worse at night    Patient presents to clinic with vaginal itching that started about 6 months ago and has gotten progressively worse. States that she is experiencing itching from the clitoris back towards the anus, which she notes is worse at night. Rates the discomfort as an 8 or 9 out of 10. She has been taking once monthly diflucan tablet since October 2017. She has also tried OTC yeast medication without relief and OTC Vagisil with some relief. She recently purchased an over the counter vaginal health test, which tested negative for yeast. Denied any increase or change in vaginal discharge or smell. States she has not been experiencing any menopausal symptoms aside from some vaginal dryness. Notes she has a personal history of genital herpes, which she has not had any issues with since her 39s and a family history of breast and uterine cancer. Denies any constitutional symptoms, urinary symptoms, genital lesions, pelvic or abdominal pain.    Relevant past medical, surgical, family and social history reviewed and updated as indicated. Interim medical history since our last visit reviewed. Allergies and medications reviewed and updated.  Review of Systems  Constitutional: Negative for chills, fatigue, fever and unexpected weight change.  HENT: Negative.   Gastrointestinal: Negative for abdominal pain, constipation, diarrhea and nausea.  Genitourinary: Negative for decreased urine volume, difficulty  urinating, dyspareunia, dysuria, enuresis, flank pain, frequency, genital sores, hematuria, menstrual problem, pelvic pain, urgency, vaginal bleeding, vaginal discharge and vaginal pain.       Intense vaginal itching from clitoris backward toward the anus.    Per HPI unless specifically indicated above     Objective:    BP 119/76 (BP Location: Left Arm, Cuff Size: Large)   Pulse 88   Temp 97.5 F (36.4 C)   Wt 199 lb 9.6 oz (90.5 kg)   LMP  (LMP Unknown)   SpO2 98%   BMI 34.05 kg/m   Wt Readings from Last 3 Encounters:  07/11/16 199 lb 9.6 oz (90.5 kg)  04/04/16 198 lb (89.8 kg)  10/11/15 198 lb (89.8 kg)    Physical Exam  Constitutional: She is oriented to person, place, and time. She appears well-developed and well-nourished.  HENT:  Head: Normocephalic and atraumatic.  Eyes: Conjunctivae are normal. Right eye exhibits no discharge. Left eye exhibits no discharge. No scleral icterus.  Cardiovascular: Normal rate, regular rhythm and normal heart sounds.   Pulmonary/Chest: Effort normal and breath sounds normal. No respiratory distress.  Genitourinary:  Genitourinary Comments: Diffuse whitening of the perineum.  Abrasions noted from scratching.    Neurological: She is alert and oriented to person, place, and time.  Psychiatric: She has a normal mood and affect. Her behavior is normal. Judgment and thought content normal.  Nursing note and vitals reviewed.     Assessment & Plan:  Problem List Items Addressed This Visit      Musculoskeletal and Integument   Vaginal itching    Diffuse whitening of the perineum typical of lichen sclerosus. Wet prep positive for yeast. Prescribed clobetasol ointment and amitriptyline for lichen sclerosus. Instructed patient to take another diflucan tablet for yeast. Valtrex x 10 days also prescribed due to history of genital herpes.      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  Will recheck in 1 months       Relevant Medications   clobetasol ointment (TEMOVATE) 0.05 %   Other Relevant Orders   WET PREP FOR [place], YEAST, CLUE       Follow up plan: Return in about 4 weeks (around 08/08/2016).

## 2016-08-06 ENCOUNTER — Encounter: Payer: Self-pay | Admitting: Unknown Physician Specialty

## 2016-08-06 ENCOUNTER — Ambulatory Visit (INDEPENDENT_AMBULATORY_CARE_PROVIDER_SITE_OTHER): Payer: 59 | Admitting: Unknown Physician Specialty

## 2016-08-06 DIAGNOSIS — N904 Leukoplakia of vulva: Secondary | ICD-10-CM

## 2016-08-06 MED ORDER — CLOBETASOL PROPIONATE 0.05 % EX OINT: 1.0000 "application " | TOPICAL_OINTMENT | Freq: Two times a day (BID) | CUTANEOUS | 1 refills | Status: DC

## 2016-08-06 MED ORDER — AMITRIPTYLINE HCL 10 MG PO TABS: 10.0000 mg | ORAL_TABLET | Freq: Every day | ORAL | 12 refills | Status: DC

## 2016-08-06 NOTE — Assessment & Plan Note (Signed)
Improving.  Continue present treatment with instructions to taper treatment as tolerated.

## 2016-08-06 NOTE — Progress Notes (Signed)
   BP 137/84 (BP Location: Left Arm, Patient Position: Sitting, Cuff Size: Large)   Pulse 90   Temp 98.2 F (36.8 C)   Wt 197 lb 3.2 oz (89.4 kg)   LMP  (LMP Unknown)   SpO2 97%   BMI 33.64 kg/m    Subjective:    Patient ID: Lorraine Melendez, female    DOB: 09/15/1960, 56 y.o.   MRN: [id number]  HPI: Lorraine Melendez is a 55 y.o. female  Chief Complaint  Patient presents with  . Lichen sclerosus    4 week f/up  . Medication Refill    pt states she needs a refill on clobetasol cream    Pt is here to f/u lichen sclerosis.  She is much better on Clobetasol and Amitriptyline at night.  She would like a refill.  Symptoms aren't completely gone but much reduced.    Relevant past medical, surgical, family and social history reviewed and updated as indicated. Interim medical history since our last visit reviewed. Allergies and medications reviewed and updated.  Review of Systems  Per HPI unless specifically indicated above     Objective:    BP 137/84 (BP Location: Left Arm, Patient Position: Sitting, Cuff Size: Large)   Pulse 90   Temp 98.2 F (36.8 C)   Wt 197 lb 3.2 oz (89.4 kg)   LMP  (LMP Unknown)   SpO2 97%   BMI 33.64 kg/m   Wt Readings from Last 3 Encounters:  08/06/16 197 lb 3.2 oz (89.4 kg)  07/11/16 199 lb 9.6 oz (90.5 kg)  04/04/16 198 lb (89.8 kg)    Physical Exam  Constitutional: She is oriented to person, place, and time. She appears well-developed and well-nourished. No distress.  HENT:  Head: Normocephalic and atraumatic.  Eyes: Conjunctivae and lids are normal. Right eye exhibits no discharge. Left eye exhibits no discharge. No scleral icterus.  Cardiovascular: Normal rate.   Pulmonary/Chest: Effort normal.  Abdominal: Normal appearance. There is no splenomegaly or hepatomegaly.  Genitourinary:  Genitourinary Comments: Mild labial erythema.  No discrete lesions  Musculoskeletal: Normal range of motion.  Neurological: She is alert and oriented  to person, place, and time.  Skin: Skin is intact. No rash noted. No pallor.  Psychiatric: She has a normal mood and affect. Her behavior is normal. Judgment and thought content normal.    Results for orders placed or performed in visit on 07/11/16  WET PREP FOR [place], YEAST, CLUE  Result Value Ref Range   Trichomonas Exam Negative Negative   Yeast Exam Positive (A) Negative   Clue Cell Exam Negative Negative      Assessment & Plan:   Problem List Items Addressed This Visit      Unprioritized   Lichen sclerosus of female genitalia    Improving.  Continue present treatment with instructions to taper treatment as tolerated.       Relevant Medications   clobetasol ointment (TEMOVATE) 0.05 %       Follow up plan: Return for physical.

## 2016-10-03 ENCOUNTER — Encounter: Payer: 59 | Admitting: Unknown Physician Specialty

## 2016-10-31 ENCOUNTER — Other Ambulatory Visit: Payer: Self-pay | Admitting: Unknown Physician Specialty

## 2016-11-04 NOTE — Telephone Encounter (Signed)
Needs appointment

## 2016-11-04 NOTE — Telephone Encounter (Signed)
Letter generated and sent to patient to call and schedule f/up appointment.

## 2016-11-05 ENCOUNTER — Ambulatory Visit (INDEPENDENT_AMBULATORY_CARE_PROVIDER_SITE_OTHER): Payer: 59 | Admitting: Unknown Physician Specialty

## 2016-11-05 ENCOUNTER — Encounter: Payer: Self-pay | Admitting: Unknown Physician Specialty

## 2016-11-05 VITALS — BP 135/75 | HR 105 | Temp 98.3°F | Ht 63.0 in | Wt 208.7 lb

## 2016-11-05 DIAGNOSIS — E785 Hyperlipidemia, unspecified: Secondary | ICD-10-CM | POA: Diagnosis not present

## 2016-11-05 DIAGNOSIS — Z0000 Encounter for general adult medical examination without abnormal findings: Secondary | ICD-10-CM | POA: Diagnosis not present

## 2016-11-05 DIAGNOSIS — R7301 Impaired fasting glucose: Secondary | ICD-10-CM | POA: Diagnosis not present

## 2016-11-05 DIAGNOSIS — I10 Essential (primary) hypertension: Secondary | ICD-10-CM

## 2016-11-05 DIAGNOSIS — N904 Leukoplakia of vulva: Secondary | ICD-10-CM | POA: Diagnosis not present

## 2016-11-05 DIAGNOSIS — Z23 Encounter for immunization: Secondary | ICD-10-CM

## 2016-11-05 NOTE — Assessment & Plan Note (Signed)
Check Lipid panel 

## 2016-11-05 NOTE — Assessment & Plan Note (Signed)
This is stable.  Continue with Temovate prn

## 2016-11-05 NOTE — Assessment & Plan Note (Signed)
Add Hgb A1C to labs

## 2016-11-05 NOTE — Assessment & Plan Note (Signed)
Stable, continue present medications.   

## 2016-11-05 NOTE — Patient Instructions (Addendum)
Tdap Vaccine (Tetanus, Diphtheria and Pertussis): What You Need to Know 1. Why get vaccinated? Tetanus, diphtheria and pertussis are very serious diseases. Tdap vaccine can protect us from these diseases. And, Tdap vaccine given to pregnant women can protect newborn babies against pertussis. TETANUS (Lockjaw) is rare in the [place] today. It causes painful muscle tightening and stiffness, usually all over the body.  It can lead to tightening of muscles in the head and neck so you can't open your mouth, swallow, or sometimes even breathe. Tetanus kills about 1 out of 10 people who are infected even after receiving the best medical care. DIPHTHERIA is also rare in the [place] today. It can cause a thick coating to form in the back of the throat.  It can lead to breathing problems, heart failure, paralysis, and death. PERTUSSIS (Whooping Cough) causes severe coughing spells, which can cause difficulty breathing, vomiting and disturbed sleep.  It can also lead to weight loss, incontinence, and rib fractures. Up to 2 in 100 adolescents and 5 in 100 adults with pertussis are hospitalized or have complications, which could include pneumonia or death. These diseases are caused by bacteria. Diphtheria and pertussis are spread from person to person through secretions from coughing or sneezing. Tetanus enters the body through cuts, scratches, or wounds. Before vaccines, as many as 200,000 cases of diphtheria, 200,000 cases of pertussis, and hundreds of cases of tetanus, were reported in the [place] each year. Since vaccination began, reports of cases for tetanus and diphtheria have dropped by about 99% and for pertussis by about 80%. 2. Tdap vaccine Tdap vaccine can protect adolescents and adults from tetanus, diphtheria, and pertussis. One dose of Tdap is routinely given at age 11 or 12. People who did not get Tdap at that age should get it as soon as possible. Tdap is especially important  for healthcare professionals and anyone having close contact with a baby younger than 12 months. Pregnant women should get a dose of Tdap during every pregnancy, to protect the newborn from pertussis. Infants are most at risk for severe, life-threatening complications from pertussis. Another vaccine, called Td, protects against tetanus and diphtheria, but not pertussis. A Td booster should be given every 10 years. Tdap may be given as one of these boosters if you have never gotten Tdap before. Tdap may also be given after a severe cut or burn to prevent tetanus infection. Your doctor or the person giving you the vaccine can give you more information. Tdap may safely be given at the same time as other vaccines. 3. Some people should not get this vaccine  A person who has ever had a life-threatening allergic reaction after a previous dose of any diphtheria, tetanus or pertussis containing vaccine, OR has a severe allergy to any part of this vaccine, should not get Tdap vaccine. Tell the person giving the vaccine about any severe allergies.  Anyone who had coma or long repeated seizures within 7 days after a childhood dose of DTP or DTaP, or a previous dose of Tdap, should not get Tdap, unless a cause other than the vaccine was found. They can still get Td.  Talk to your doctor if you:  have seizures or another nervous system problem,  had severe pain or swelling after any vaccine containing diphtheria, tetanus or pertussis,  ever had a condition called Guillain-Barr Syndrome (GBS),  aren't feeling well on the day the shot is scheduled. 4. Risks With any medicine, including vaccines, there is   a chance of side effects. These are usually mild and go away on their own. Serious reactions are also possible but are rare. Most people who get Tdap vaccine do not have any problems with it. Mild problems following Tdap:  (Did not interfere with activities)  Pain where the shot was given (about 3 in 4  adolescents or 2 in 3 adults)  Redness or swelling where the shot was given (about 1 person in 5)  Mild fever of at least 100.24F (up to about 1 in 25 adolescents or 1 in 100 adults)  Headache (about 3 or 4 people in 10)  Tiredness (about 1 person in 3 or 4)  Nausea, vomiting, diarrhea, stomach ache (up to 1 in 4 adolescents or 1 in 10 adults)  Chills, sore joints (about 1 person in 10)  Body aches (about 1 person in 3 or 4)  Rash, swollen glands (uncommon) Moderate problems following Tdap:  (Interfered with activities, but did not require medical attention)  Pain where the shot was given (up to 1 in 5 or 6)  Redness or swelling where the shot was given (up to about 1 in 16 adolescents or 1 in 12 adults)  Fever over 102F (about 1 in 100 adolescents or 1 in 250 adults)  Headache (about 1 in 7 adolescents or 1 in 10 adults)  Nausea, vomiting, diarrhea, stomach ache (up to 1 or 3 people in 100)  Swelling of the entire arm where the shot was given (up to about 1 in 500). Severe problems following Tdap:  (Unable to perform usual activities; required medical attention)  Swelling, severe pain, bleeding and redness in the arm where the shot was given (rare). Problems that could happen after any vaccine:   People sometimes faint after a medical procedure, including vaccination. Sitting or lying down for about 15 minutes can help prevent fainting, and injuries caused by a fall. Tell your doctor if you feel dizzy, or have vision changes or ringing in the ears.  Some people get severe pain in the shoulder and have difficulty moving the arm where a shot was given. This happens very rarely.  Any medication can cause a severe allergic reaction. Such reactions from a vaccine are very rare, estimated at fewer than 1 in a million doses, and would happen within a few minutes to a few hours after the vaccination. As with any medicine, there is a very remote chance of a vaccine causing a  serious injury or death. The safety of vaccines is always being monitored. For more information, visit: http://www.aguilar.org/ 5. What if there is a serious problem? What should I look for?  Look for anything that concerns you, such as signs of a severe allergic reaction, very high fever, or unusual behavior. Signs of a severe allergic reaction can include hives, swelling of the face and throat, difficulty breathing, a fast heartbeat, dizziness, and weakness. These would usually start a few minutes to a few hours after the vaccination. What should I do?   If you think it is a severe allergic reaction or other emergency that can't wait, call 9-1-1 or get the person to the nearest hospital. Otherwise, call your doctor.  Afterward, the reaction should be reported to the Vaccine Adverse Event Reporting System (VAERS). Your doctor might file this report, or you can do it yourself through the VAERS web site at www.vaers.SamedayNews.es, or by calling 2392699893.  VAERS does not give medical advice. 6. The National Vaccine Injury [employer]  Vaccine Injury Compensation Program (VICP) is a federal program that was created to compensate people who may have been injured by certain vaccines. Persons who believe they may have been injured by a vaccine can learn about the program and about filing a claim by calling (940) 019-1326 or visiting the [place] website at GoldCloset.com.ee. There is a time limit to file a claim for compensation. 7. How can I learn more?  Ask your doctor. He or she can give you the vaccine package insert or suggest other sources of information.  Call your local or state health department.  Contact the Centers for Disease Control and Prevention (CDC):  Call 415-739-0003 (1-800-CDC-INFO) or  Visit CDC's website at http://hunter.com/ CDC Tdap Vaccine VIS (08/02/13) This information is not intended to replace advice given to you by your health  care provider. Make sure you discuss any questions you have with your health care provider. ------------------------------------ Please do call to schedule your mammogram; the number to schedule one at either [facility] or [facility] is [phone]

## 2016-11-05 NOTE — Progress Notes (Signed)
BP 135/75   Pulse (!) 105   Temp 98.3 F (36.8 C)   Ht 5\' 3"  (1.6 m)   Wt 208 lb 11.2 oz (94.7 kg)   LMP  (LMP Unknown)   SpO2 97%   BMI 36.97 kg/m    Subjective:    Patient ID: Lorraine Melendez, female    DOB: Dec 17, 1960, 56 y.o.   MRN: [id number]  HPI: Lorraine Melendez is a 56 y.o. female  Chief Complaint  Patient presents with  . Annual Exam   Hypertension Using medications without difficulty Average home BPs checks only occasionally.     No problems or lightheadedness No chest pain with exertion or shortness of breath No Edema  Hyperlipidemia Using medications without problems: No Muscle aches  Diet compliance/Exercise: Not consistent with either  Lichen Sclerosis of vagina Doing better.  Uses the cream off and on  Insomnia Doing well with Melatonin.    Social History   Social History  . Marital status: Married    Spouse name: N/A  . Number of children: N/A  . Years of education: N/A   Occupational History  . Not on file.   Social History Main Topics  . Smoking status: Former Research scientist (life sciences)  . Smokeless tobacco: Never Used  . Alcohol use No  . Drug use: No  . Sexual activity: Yes   Other Topics Concern  . Not on file   Social History Narrative  . No narrative on file   Family History  Problem Relation Age of Onset  . Hypertension Mother   . Cancer Mother        breast and uterine  . Breast cancer Mother 28  . Hyperlipidemia Mother   . Hyperlipidemia Father   . Cancer Father        lung  . Hypertension Brother   . Hyperlipidemia Brother   . Cancer Maternal Grandmother        breast and skin  . Breast cancer Maternal Grandmother 60  . Hyperlipidemia Brother   . Hypertension Brother    Past Medical History:  Diagnosis Date  . Anxiety   . Hyperlipidemia   . Hypertension    Past Surgical History:  Procedure Laterality Date  . TUBAL LIGATION     Relevant past medical, surgical, family and social history reviewed and updated as  indicated. Interim medical history since our last visit reviewed. Allergies and medications reviewed and updated.  Review of Systems  Per HPI unless specifically indicated above     Objective:    BP 135/75   Pulse (!) 105   Temp 98.3 F (36.8 C)   Ht 5\' 3"  (1.6 m)   Wt 208 lb 11.2 oz (94.7 kg)   LMP  (LMP Unknown)   SpO2 97%   BMI 36.97 kg/m   Wt Readings from Last 3 Encounters:  11/05/16 208 lb 11.2 oz (94.7 kg)  08/06/16 197 lb 3.2 oz (89.4 kg)  07/11/16 199 lb 9.6 oz (90.5 kg)    Physical Exam  Constitutional: She is oriented to person, place, and time. She appears well-developed and well-nourished. No distress.  HENT:  Head: Normocephalic and atraumatic.  Eyes: Conjunctivae and lids are normal. Pupils are equal, round, and reactive to light. Right eye exhibits no discharge. Left eye exhibits no discharge. No scleral icterus.  Neck: Normal range of motion. Neck supple. No JVD present. Carotid bruit is not present. No thyromegaly present.  Cardiovascular: Normal rate, regular rhythm and normal heart sounds.  Exam reveals no gallop and no friction rub.   No murmur heard. Pulmonary/Chest: Effort normal and breath sounds normal. No respiratory distress. She has no wheezes. She has no rales.  Abdominal: Soft. Normal appearance and bowel sounds are normal. There is no splenomegaly or hepatomegaly. There is no tenderness. There is no rebound.  Genitourinary: No breast swelling, tenderness or discharge.  Musculoskeletal: Normal range of motion.  Lymphadenopathy:    She has no cervical adenopathy.  Neurological: She is alert and oriented to person, place, and time.  Skin: Skin is warm, dry and intact. No rash noted. No pallor.  Psychiatric: She has a normal mood and affect. Her speech is normal and behavior is normal. Judgment and thought content normal. Cognition and memory are normal.    Results for orders placed or performed in visit on 07/11/16  WET PREP FOR [place], YEAST,  CLUE  Result Value Ref Range   Trichomonas Exam Negative Negative   Yeast Exam Positive (A) Negative   Clue Cell Exam Negative Negative      Assessment & Plan:   Problem List Items Addressed This Visit      Unprioritized   Hyperlipidemia    Check Lipid panel      Hypertension    Stable, continue present medications.        Relevant Orders   Comprehensive metabolic panel   Lipid Panel w/o Chol/HDL Ratio   Impaired fasting glucose    Add Hgb A1C to labs      Relevant Orders   Hemoglobin R9F   Lichen sclerosus of female genitalia    This is stable.  Continue with Temovate prn       Other Visit Diagnoses    Need for Tdap vaccination    -  Primary   Relevant Orders   Tdap vaccine greater than or equal to 7yo IM (Completed)   Routine general medical examination at a health care facility       Relevant Orders   Ambulatory referral to Gastroenterology   CBC with Differential/Platelet   Comprehensive metabolic panel   Lipid Panel w/o Chol/HDL Ratio   TSH   VITAMIN D 25 Hydroxy (Vit-D Deficiency, Fractures)       Follow up plan: Return in about 6 months (around 05/08/2017).

## 2016-11-06 ENCOUNTER — Encounter: Payer: Self-pay | Admitting: Unknown Physician Specialty

## 2016-11-06 LAB — COMPREHENSIVE METABOLIC PANEL
ALK PHOS: 85 IU/L (ref 39–117)
ALT: 35 IU/L — ABNORMAL HIGH (ref 0–32)
AST: 23 IU/L (ref 0–40)
Albumin/Globulin Ratio: 1.8 (ref 1.2–2.2)
Albumin: 4.4 g/dL (ref 3.5–5.5)
BILIRUBIN TOTAL: 0.3 mg/dL (ref 0.0–1.2)
BUN/Creatinine Ratio: 14 (ref 9–23)
BUN: 10 mg/dL (ref 6–24)
CO2: 25 mmol/L (ref 18–29)
Calcium: 9.6 mg/dL (ref 8.7–10.2)
Chloride: 103 mmol/L (ref 96–106)
Creatinine, Ser: 0.74 mg/dL (ref 0.57–1.00)
GFR calc Af Amer: 104 mL/min/{1.73_m2} (ref >59)
GFR calc non Af Amer: 90 mL/min/{1.73_m2} (ref >59)
GLOBULIN, TOTAL: 2.5 g/dL (ref 1.5–4.5)
Glucose: 113 mg/dL — ABNORMAL HIGH (ref 65–99)
POTASSIUM: 4.1 mmol/L (ref 3.5–5.2)
SODIUM: 141 mmol/L (ref 134–144)
Total Protein: 6.9 g/dL (ref 6.0–8.5)

## 2016-11-06 LAB — HEMOGLOBIN A1C
Est. average glucose Bld gHb Est-mCnc: 114 mg/dL
HEMOGLOBIN A1C: 5.6 % (ref 4.8–5.6)

## 2016-11-06 LAB — LIPID PANEL W/O CHOL/HDL RATIO
CHOLESTEROL TOTAL: 182 mg/dL (ref 100–199)
HDL: 41 mg/dL (ref >39)
LDL Calculated: 89 mg/dL (ref 0–99)
TRIGLYCERIDES: 262 mg/dL — AB (ref 0–149)
VLDL Cholesterol Cal: 52 mg/dL — ABNORMAL HIGH (ref 5–40)

## 2016-11-06 LAB — CBC WITH DIFFERENTIAL/PLATELET
BASOS: 1 %
Basophils Absolute: 0.1 10*3/uL (ref 0.0–0.2)
EOS (ABSOLUTE): 0.3 10*3/uL (ref 0.0–0.4)
EOS: 5 %
HEMATOCRIT: 42.8 % (ref 34.0–46.6)
Hemoglobin: 14.5 g/dL (ref 11.1–15.9)
Immature Grans (Abs): 0 10*3/uL (ref 0.0–0.1)
Immature Granulocytes: 0 %
LYMPHS ABS: 2.2 10*3/uL (ref 0.7–3.1)
Lymphs: 29 %
MCH: 30.7 pg (ref 26.6–33.0)
MCHC: 33.9 g/dL (ref 31.5–35.7)
MCV: 91 fL (ref 79–97)
MONOS ABS: 0.6 10*3/uL (ref 0.1–0.9)
Monocytes: 7 %
Neutrophils Absolute: 4.4 10*3/uL (ref 1.4–7.0)
Neutrophils: 58 %
Platelets: 184 10*3/uL (ref 150–379)
RBC: 4.73 x10E6/uL (ref 3.77–5.28)
RDW: 14.5 % (ref 12.3–15.4)
WBC: 7.6 10*3/uL (ref 3.4–10.8)

## 2016-11-06 LAB — TSH: TSH: 2.91 u[IU]/mL (ref 0.450–4.500)

## 2016-11-06 LAB — VITAMIN D 25 HYDROXY (VIT D DEFICIENCY, FRACTURES): Vit D, 25-Hydroxy: 28.8 ng/mL — ABNORMAL LOW (ref 30.0–100.0)

## 2016-11-13 IMAGING — MG MM DIGITAL SCREENING BILAT W/ CAD
5 series · 5 of 5 positions shown · non-contrast
Comparison: Previous exam(s).

CLINICAL DATA: Screening.

EXAM:
DIGITAL SCREENING BILATERAL MAMMOGRAM WITH CAD

[R CC]
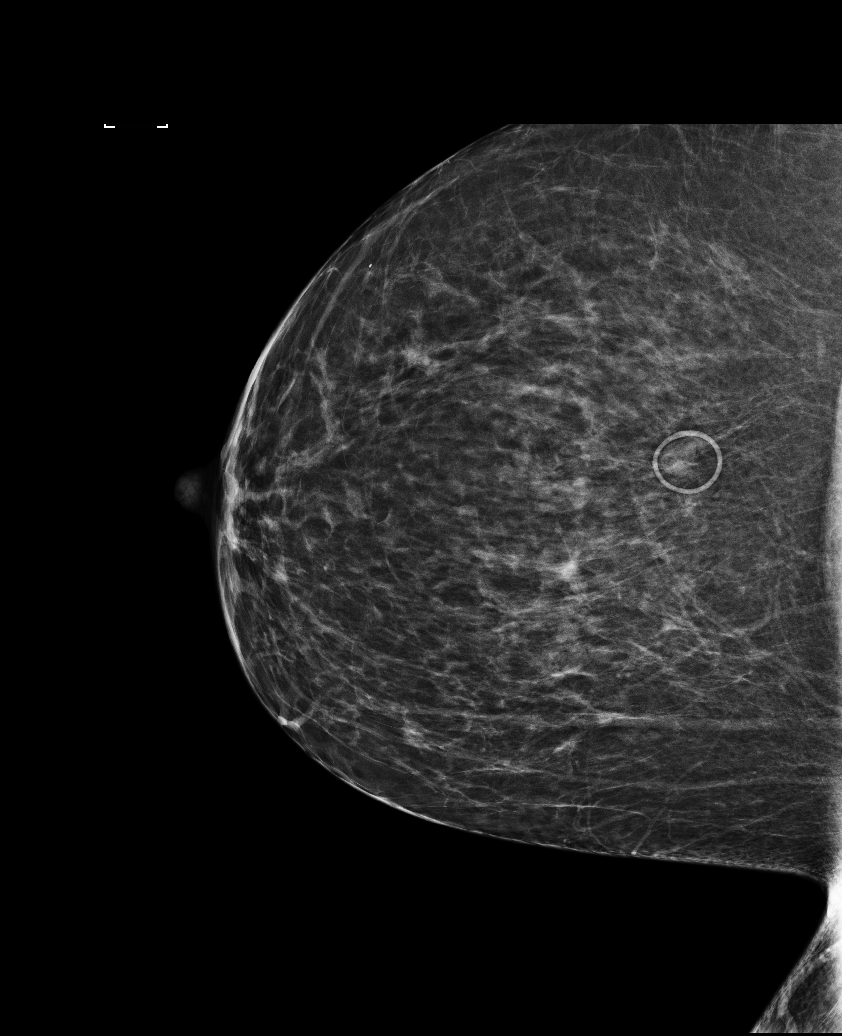

[L XCCL]
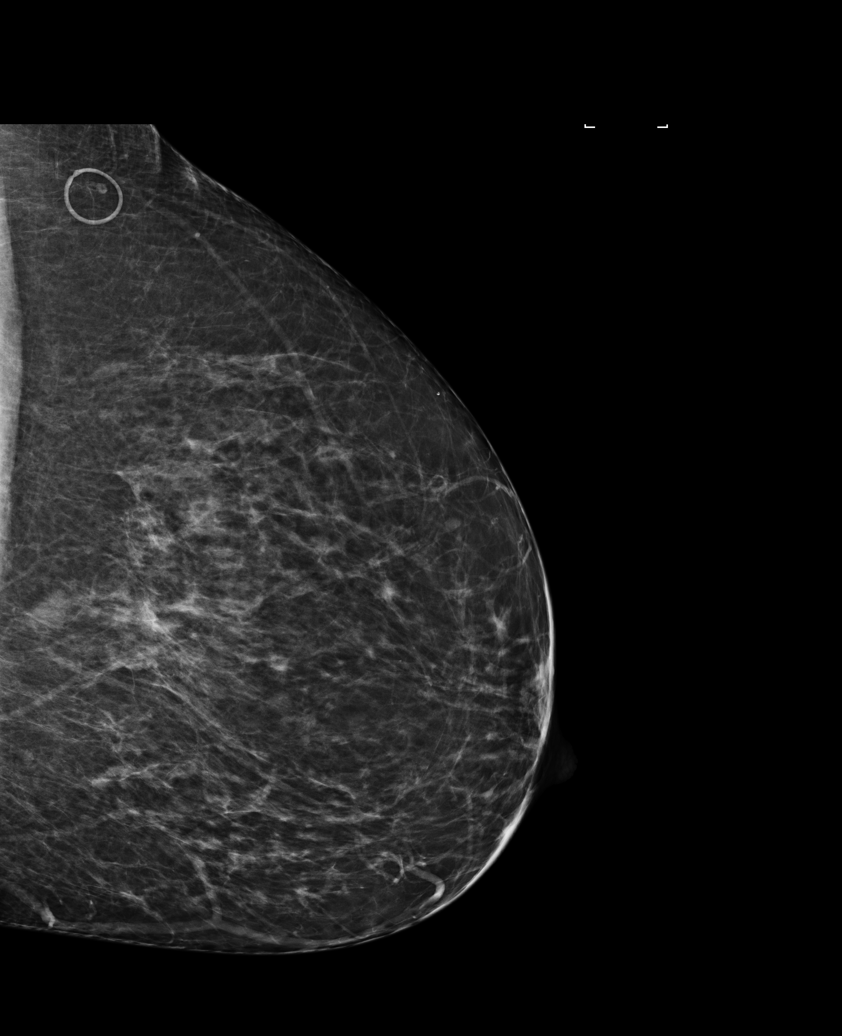

[R MLO]
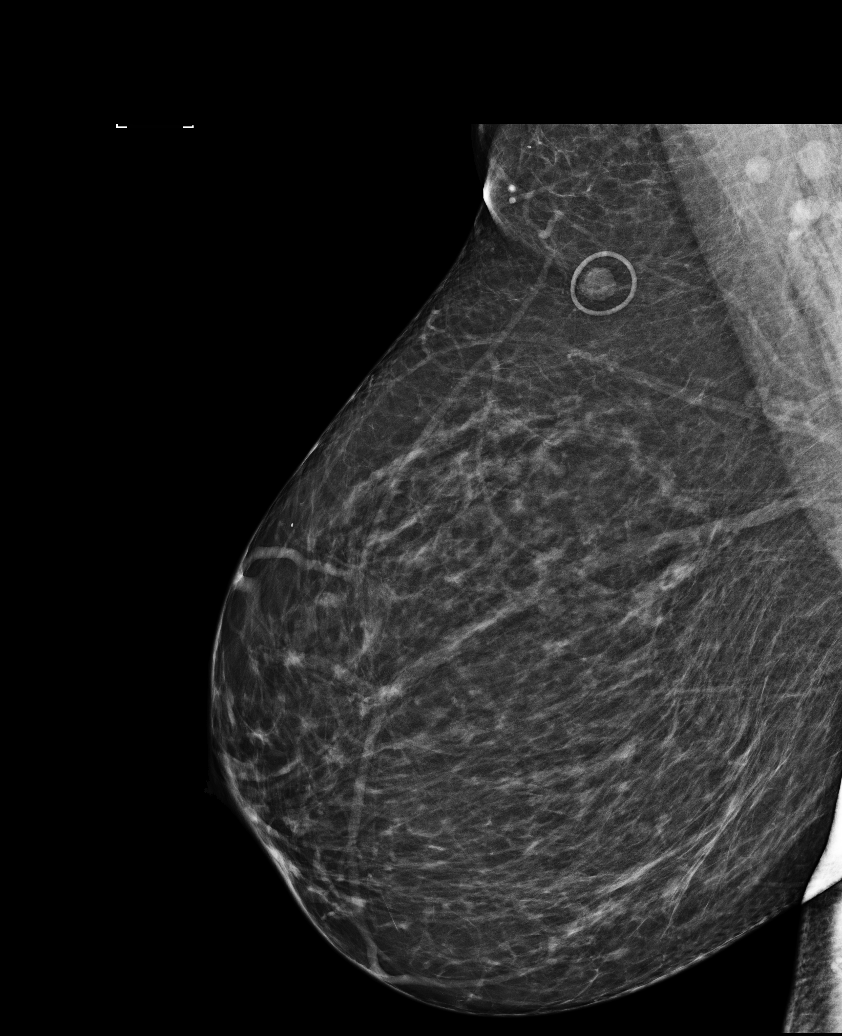

[L CC]
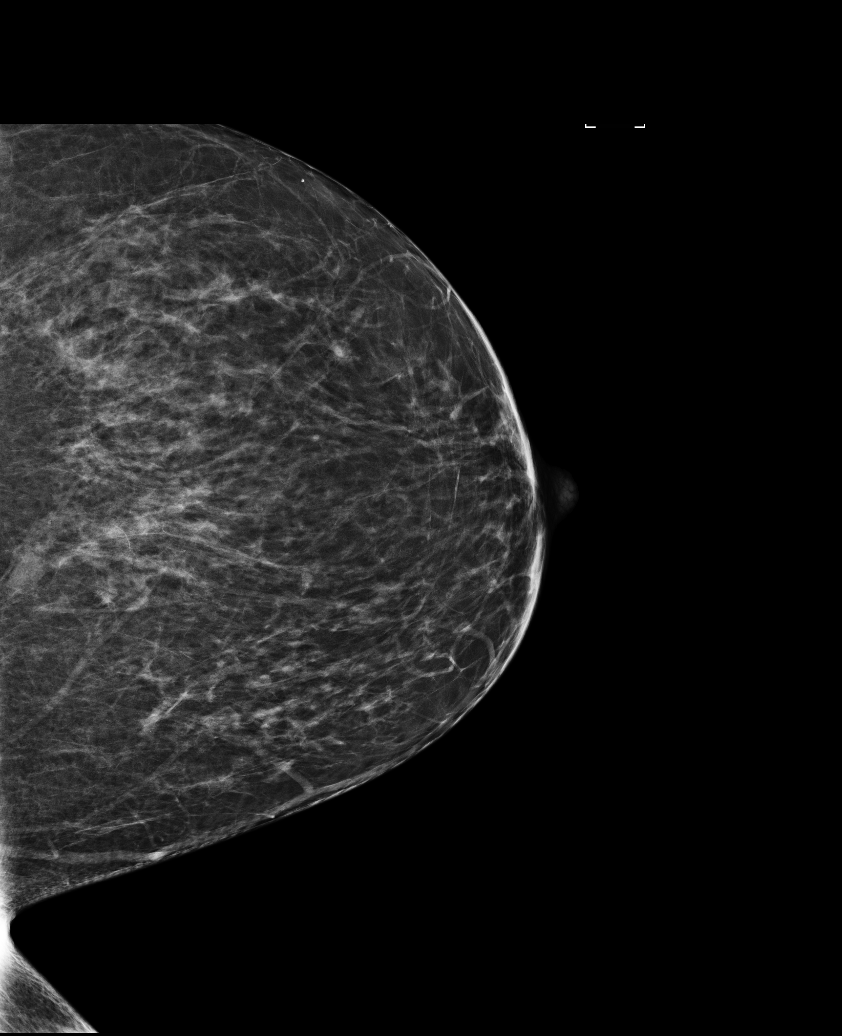

[L MLO]
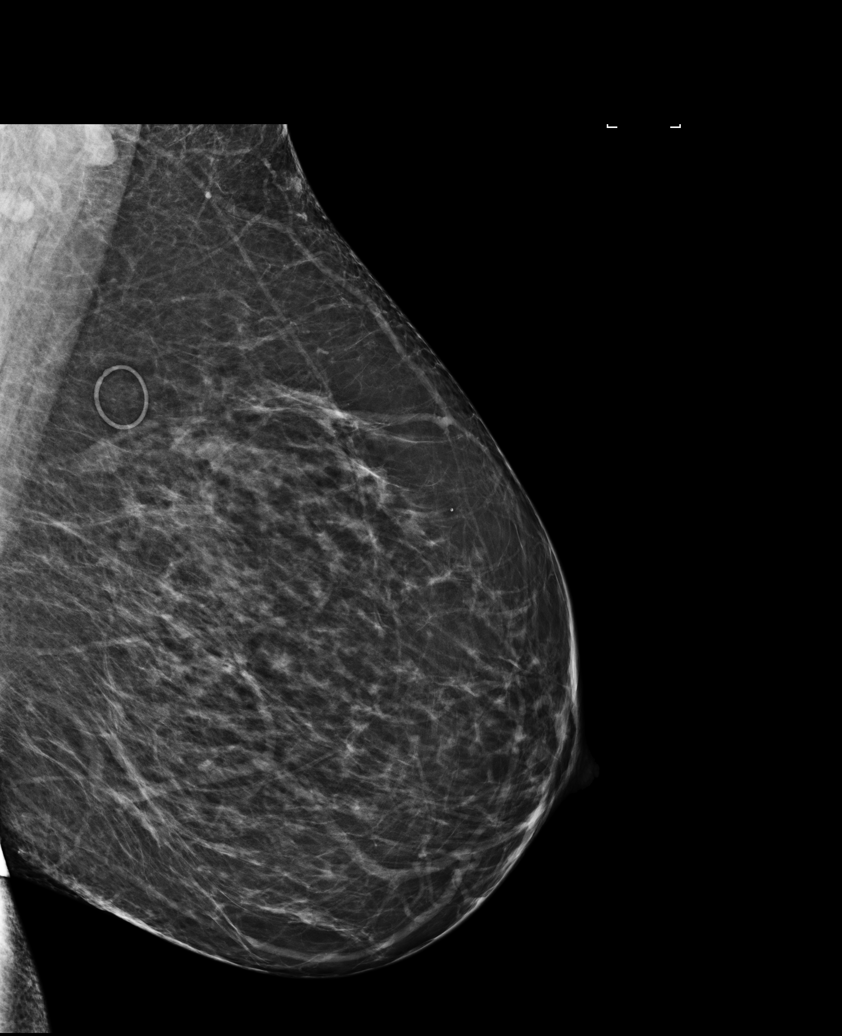

[5 of 5 positions shown; findings below may reference images not displayed]

ACR Breast Density Category b: There are scattered areas of
fibroglandular density.
FINDINGS: There are no findings suspicious for malignancy. Images were
processed with CAD.
IMPRESSION: No mammographic evidence of malignancy. A result letter of this
screening mammogram will be mailed directly to the patient.

RECOMMENDATION:
Screening mammogram in one year. (Code:AS-G-LCT)

BI-RADS CATEGORY  1: Negative.

## 2016-11-18 ENCOUNTER — Telehealth: Payer: Self-pay | Admitting: Gastroenterology

## 2016-11-18 NOTE — Telephone Encounter (Signed)
Patient LVM and is ready to schedule her colonoscopy. 

## 2016-11-18 NOTE — Telephone Encounter (Signed)
Returning patient's call per notes received:  Patient Lorraine Melendez and is ready to schedule her colonoscopy.  Unable to leave message due to vm box being full.

## 2016-11-19 ENCOUNTER — Other Ambulatory Visit: Payer: Self-pay

## 2016-11-19 ENCOUNTER — Telehealth: Payer: Self-pay | Admitting: Gastroenterology

## 2016-11-19 DIAGNOSIS — Z1211 Encounter for screening for malignant neoplasm of colon: Secondary | ICD-10-CM

## 2016-11-19 DIAGNOSIS — Z1212 Encounter for screening for malignant neoplasm of rectum: Principal | ICD-10-CM

## 2016-11-19 NOTE — Telephone Encounter (Signed)
Patient needs to set up appt for a colonoscopy. If she doesn't answer please continue to try. She has a lot of meeting to attend.

## 2016-11-19 NOTE — Telephone Encounter (Signed)
Gastroenterology Pre-Procedure Review  Request Date: 7/12 Requesting Physician: Dr. Vicente Males  PATIENT REVIEW QUESTIONS: The patient responded to the following health history questions as indicated:    1. Are you having any GI issues? no 2. Do you have a personal history of Polyps? no 3. Do you have a family history of Colon Cancer or Polyps? no 4. Diabetes Mellitus? no 5. Joint replacements in the past 12 months?no 6. Major health problems in the past 3 months?no 7. Any artificial heart valves, MVP, or defibrillator?no    MEDICATIONS & ALLERGIES:    Patient reports the following regarding taking any anticoagulation/antiplatelet therapy:   Plavix, Coumadin, Eliquis, Xarelto, Lovenox, Pradaxa, Brilinta, or Effient? no Aspirin? no  Patient confirms/reports the following medications:  Current Outpatient Prescriptions  Medication Sig Dispense Refill  . atorvastatin (LIPITOR) 20 MG tablet Take 1 tablet (20 mg total) by mouth daily at 6 PM. Please call to make an appointment for more refills 90 tablet 0  . clobetasol ointment (TEMOVATE) 5.72 % Apply 1 application topically 2 (two) times daily. 30 g 1  . losartan (COZAAR) 50 MG tablet Take 1 tablet (50 mg total) by mouth daily. 90 tablet 1  . valACYclovir (VALTREX) 500 MG tablet Take 1 tablet (500 mg total) by mouth 2 (two) times daily. (Patient not taking: Reported on 08/06/2016) 20 tablet 0   No current facility-administered medications for this visit.     Patient confirms/reports the following allergies:  No Known Allergies  No orders of the defined types were placed in this encounter.   AUTHORIZATION INFORMATION Primary Insurance: 1D#: Group #:  Secondary Insurance: 1D#: Group #:  SCHEDULE INFORMATION: Date: 7/12 Time: Location: [place]

## 2016-11-27 ENCOUNTER — Other Ambulatory Visit: Payer: Self-pay | Admitting: Unknown Physician Specialty

## 2016-11-27 ENCOUNTER — Ambulatory Visit
Admission: RE | Admit: 2016-11-27 | Discharge: 2016-11-27 | Disposition: A | Discharge status: Home / Self Care | Payer: 59 | Source: Ambulatory Visit | Attending: Unknown Physician Specialty | Admitting: Unknown Physician Specialty

## 2016-11-27 DIAGNOSIS — Z1231 Encounter for screening mammogram for malignant neoplasm of breast: Secondary | ICD-10-CM | POA: Diagnosis present

## 2016-11-27 DIAGNOSIS — Z0000 Encounter for general adult medical examination without abnormal findings: Secondary | ICD-10-CM

## 2016-12-23 ENCOUNTER — Ambulatory Visit: Payer: 59 | Admitting: Anesthesiology

## 2016-12-23 ENCOUNTER — Ambulatory Visit
Admission: RE | Admit: 2016-12-23 | Discharge: 2016-12-23 | Disposition: A | Discharge status: Home / Self Care | Payer: 59 | Source: Ambulatory Visit | Attending: Gastroenterology | Admitting: Gastroenterology

## 2016-12-23 ENCOUNTER — Encounter
Admission: RE | Disposition: A | Discharge status: Home / Self Care | Payer: Self-pay | Source: Ambulatory Visit | Attending: Gastroenterology

## 2016-12-23 DIAGNOSIS — D127 Benign neoplasm of rectosigmoid junction: Secondary | ICD-10-CM

## 2016-12-23 DIAGNOSIS — Z1211 Encounter for screening for malignant neoplasm of colon: Secondary | ICD-10-CM | POA: Diagnosis not present

## 2016-12-23 DIAGNOSIS — E785 Hyperlipidemia, unspecified: Secondary | ICD-10-CM | POA: Insufficient documentation

## 2016-12-23 DIAGNOSIS — Z1212 Encounter for screening for malignant neoplasm of rectum: Secondary | ICD-10-CM | POA: Diagnosis not present

## 2016-12-23 DIAGNOSIS — K635 Polyp of colon: Secondary | ICD-10-CM | POA: Insufficient documentation

## 2016-12-23 DIAGNOSIS — Z6835 Body mass index (BMI) 35.0-35.9, adult: Secondary | ICD-10-CM | POA: Diagnosis not present

## 2016-12-23 DIAGNOSIS — Z79899 Other long term (current) drug therapy: Secondary | ICD-10-CM | POA: Diagnosis not present

## 2016-12-23 DIAGNOSIS — D122 Benign neoplasm of ascending colon: Secondary | ICD-10-CM | POA: Insufficient documentation

## 2016-12-23 DIAGNOSIS — I10 Essential (primary) hypertension: Secondary | ICD-10-CM | POA: Diagnosis not present

## 2016-12-23 DIAGNOSIS — K640 First degree hemorrhoids: Secondary | ICD-10-CM | POA: Diagnosis not present

## 2016-12-23 DIAGNOSIS — Z87891 Personal history of nicotine dependence: Secondary | ICD-10-CM | POA: Diagnosis not present

## 2016-12-23 DIAGNOSIS — E669 Obesity, unspecified: Secondary | ICD-10-CM | POA: Insufficient documentation

## 2016-12-23 HISTORY — PX: COLONOSCOPY WITH PROPOFOL: SHX5780

## 2016-12-23 SURGERY — COLONOSCOPY WITH PROPOFOL
Anesthesia: General

## 2016-12-23 MED ORDER — PROPOFOL 500 MG/50ML IV EMUL
INTRAVENOUS | Status: DC | PRN
  Administered 2016-12-23: 100 ug/kg/min via INTRAVENOUS

## 2016-12-23 MED ORDER — PROPOFOL 10 MG/ML IV BOLUS
INTRAVENOUS | Status: DC | PRN
  Administered 2016-12-23: 60 mg via INTRAVENOUS
  Administered 2016-12-23 (×2): 20 mg via INTRAVENOUS

## 2016-12-23 MED ORDER — LIDOCAINE HCL (PF) 2 % IJ SOLN
INTRAMUSCULAR | Status: AC
  Filled 2016-12-23: qty 2

## 2016-12-23 MED ORDER — SODIUM CHLORIDE 0.9 % IV SOLN
INTRAVENOUS | Status: DC
  Administered 2016-12-23: 08:00:00 via INTRAVENOUS

## 2016-12-23 MED ORDER — PROPOFOL 500 MG/50ML IV EMUL
INTRAVENOUS | Status: AC
  Filled 2016-12-23: qty 50

## 2016-12-23 MED ORDER — SODIUM CHLORIDE 0.9 % IV SOLN
INTRAVENOUS | Status: DC | PRN
  Administered 2016-12-23: 08:00:00 via INTRAVENOUS

## 2016-12-23 NOTE — Op Note (Signed)
[facility] Patient Name: Lorraine Melendez Procedure Date: 12/23/2016 8:02 AM MRN: [id number] Account #: [id number] Date of Birth: August 12, 1960 Admit Type: Outpatient Age: 56 Room: [facility] ENDO ROOM 1 Gender: Female Note Status: Finalized Procedure:            Colonoscopy Indications:          Screening for colorectal malignant neoplasm Providers:            Jonathon Bellows MD, MD Referring MD:         Kathrine Haddock (Referring MD) Medicines:            Monitored Anesthesia Care Complications:        No immediate complications. Procedure:            Pre-Anesthesia Assessment:                       - Prior to the procedure, a History and Physical was                        performed, and patient medications, allergies and                        sensitivities were reviewed. The patient's tolerance of                        previous anesthesia was reviewed.                       - The risks and benefits of the procedure and the                        sedation options and risks were discussed with the                        patient. All questions were answered and informed                        consent was obtained.                       - ASA Grade Assessment: II - A patient with mild                        systemic disease.                       After obtaining informed consent, the colonoscope was                        passed under direct vision. Throughout the procedure,                        the patient's blood pressure, pulse, and oxygen                        saturations were monitored continuously. The                        Colonoscope was introduced through the anus and  advanced to the the cecum, identified by the                        appendiceal orifice, IC valve and transillumination.                        The colonoscopy was performed with ease. The patient                        tolerated the procedure well. The quality of  the bowel                        preparation was good. Findings:      The perianal and digital rectal examinations were normal.      Non-bleeding internal hemorrhoids were found during retroflexion. The       hemorrhoids were small and Grade I (internal hemorrhoids that do not       prolapse).      A 6 mm polyp was found in the ascending colon. The polyp was sessile.       The polyp was removed with a cold snare. Resection and retrieval were       complete.      Five sessile polyps were found in the recto-sigmoid colon. The polyps       were 3 to 5 mm in size. These polyps were removed with a cold biopsy       forceps. Resection and retrieval were complete.      The exam was otherwise without abnormality on direct and retroflexion       views. Impression:           - Non-bleeding internal hemorrhoids.                       - One 6 mm polyp in the ascending colon, removed with a                        cold snare. Resected and retrieved.                       - Five 3 to 5 mm polyps at the recto-sigmoid colon,                        removed with a cold biopsy forceps. Resected and                        retrieved.                       - The examination was otherwise normal on direct and                        retroflexion views. Recommendation:       - Discharge patient to home (with escort).                       - Resume previous diet.                       - Continue present medications.                       -  Await pathology results.                       - Repeat colonoscopy for surveillance based on                        pathology results. Procedure Code(s):    --- Professional ---                       [phone], Colonoscopy, flexible; with removal of tumor(s),                        polyp(s), or other lesion(s) by snare technique                       45380, 22, Colonoscopy, flexible; with biopsy, single                        or multiple Diagnosis Code(s):    --- Professional  ---                       Z12.11, Encounter for screening for malignant neoplasm                        of colon                       D12.2, Benign neoplasm of ascending colon                       D12.7, Benign neoplasm of rectosigmoid junction                       K64.0, First degree hemorrhoids CPT copyright 2016 American Medical Association. All rights reserved. The codes documented in this report are preliminary and upon coder review may  be revised to meet current compliance requirements. Jonathon Bellows, MD Jonathon Bellows MD, MD 12/23/2016 8:28:34 AM This report has been signed electronically. Number of Addenda: 0 Note Initiated On: 12/23/2016 8:02 AM Scope Withdrawal Time: 0 hours 12 minutes 22 seconds  Total Procedure Duration: 0 hours 20 minutes 11 seconds       [facility]

## 2016-12-23 NOTE — Anesthesia Preprocedure Evaluation (Signed)
Anesthesia Evaluation  Patient identified by MRN, date of birth, ID band Patient awake    Reviewed: Allergy & Precautions, NPO status , Patient's Chart, lab work & pertinent test results  History of Anesthesia Complications Negative for: history of anesthetic complications  Airway Mallampati: III  TM Distance: >3 FB Neck ROM: Full    Dental no notable dental hx.    Pulmonary neg sleep apnea, neg COPD, former smoker,    breath sounds clear to auscultation- rhonchi (-) wheezing      Cardiovascular Exercise Tolerance: Good hypertension, Pt. on medications (-) CAD, (-) Past MI and (-) Cardiac Stents  Rhythm:Regular Rate:Normal - Systolic murmurs and - Diastolic murmurs    Neuro/Psych Anxiety negative neurological ROS     GI/Hepatic negative GI ROS, Neg liver ROS,   Endo/Other  negative endocrine ROSneg diabetes  Renal/GU negative Renal ROS     Musculoskeletal negative musculoskeletal ROS (+)   Abdominal (+) + obese,   Peds  Hematology negative hematology ROS (+)   Anesthesia Other Findings Past Medical History: No date: Anxiety No date: Hyperlipidemia No date: Hypertension   Reproductive/Obstetrics                             Anesthesia Physical Anesthesia Plan  ASA: II  Anesthesia Plan: General   Post-op Pain Management:    Induction: Intravenous  PONV Risk Score and Plan: 2 and Propofol  Airway Management Planned: Natural Airway  Additional Equipment:   Intra-op Plan:   Post-operative Plan:   Informed Consent: I have reviewed the patients History and Physical, chart, labs and discussed the procedure including the risks, benefits and alternatives for the proposed anesthesia with the patient or authorized representative who has indicated his/her understanding and acceptance.   Dental advisory given  Plan Discussed with: CRNA and Anesthesiologist  Anesthesia Plan  Comments:         Anesthesia Quick Evaluation

## 2016-12-23 NOTE — Transfer of Care (Signed)
Immediate Anesthesia Transfer of Care Note  Patient: Lorraine Melendez  Procedure(s) Performed: Procedure(s): COLONOSCOPY WITH PROPOFOL (N/A)  Patient Location: Endoscopy Unit  Anesthesia Type:General  Level of Consciousness: awake and alert   Airway & Oxygen Therapy: Patient Spontanous Breathing and Patient connected to nasal cannula oxygen  Post-op Assessment: Report given to RN and Post -op Vital signs reviewed and stable  Post vital signs: Reviewed and stable  Last Vitals:  Vitals:   12/23/16 0718  BP: 135/70  Pulse: 74  Resp: 18  Temp: 36.8 C    Last Pain:  Vitals:   12/23/16 0718  TempSrc: Tympanic         Complications: No apparent anesthesia complications

## 2016-12-23 NOTE — Anesthesia Postprocedure Evaluation (Signed)
Anesthesia Post Note  Patient: Lorraine Melendez  Procedure(s) Performed: Procedure(s) (LRB): COLONOSCOPY WITH PROPOFOL (N/A)  Patient location during evaluation: Endoscopy Anesthesia Type: General Level of consciousness: awake and alert and oriented Pain management: pain level controlled Vital Signs Assessment: post-procedure vital signs reviewed and stable Respiratory status: spontaneous breathing, nonlabored ventilation and respiratory function stable Cardiovascular status: blood pressure returned to baseline and stable Postop Assessment: no signs of nausea or vomiting Anesthetic complications: no     Last Vitals:  Vitals:   12/23/16 0718 12/23/16 0831  BP: 135/70 (!) 104/43  Pulse: 74 80  Resp: 18 20  Temp: 36.8 C (!) 36.3 C    Last Pain:  Vitals:   12/23/16 0831  TempSrc: Tympanic                 Zaryah Seckel

## 2016-12-23 NOTE — Addendum Note (Signed)
Addendum  created 12/23/16 1001 by Emmie Niemann, MD   Anesthesia Event edited

## 2016-12-23 NOTE — H&P (Signed)
Jonathon Bellows MD [address] [place], [place] [address] Phone: [phone] Fax : [phone]  Primary Care Physician:  Kathrine Haddock, NP Primary Gastroenterologist:  Dr. Jonathon Bellows   Pre-Procedure History & Physical: HPI:  Lorraine Melendez is a 56 y.o. female is here for an colonoscopy.   Past Medical History:  Diagnosis Date  . Anxiety   . Hyperlipidemia   . Hypertension     Past Surgical History:  Procedure Laterality Date  . TUBAL LIGATION      Prior to Admission medications   Medication Sig Start Date End Date Taking? Authorizing Provider  clobetasol ointment (TEMOVATE) 1.19 % Apply 1 application topically 2 (two) times daily. 08/06/16  Yes Kathrine Haddock, NP  losartan (COZAAR) 50 MG tablet TAKE 1 TABLET BY MOUTH  DAILY 11/28/16  Yes Kathrine Haddock, NP  atorvastatin (LIPITOR) 20 MG tablet Take 1 tablet (20 mg total) by mouth daily at 6 PM. Please call to make an appointment for more refills 11/04/16   Park Liter P, DO  valACYclovir (VALTREX) 500 MG tablet Take 1 tablet (500 mg total) by mouth 2 (two) times daily. Patient not taking: Reported on 08/06/2016 07/11/16   Kathrine Haddock, NP    Allergies as of 11/19/2016  . (No Known Allergies)    Family History  Problem Relation Age of Onset  . Hypertension Mother   . Cancer Mother        breast and uterine  . Breast cancer Mother 14  . Hyperlipidemia Mother   . Hyperlipidemia Father   . Cancer Father        lung  . Hypertension Brother   . Hyperlipidemia Brother   . Cancer Maternal Grandmother        breast and skin  . Breast cancer Maternal Grandmother 60  . Hyperlipidemia Brother   . Hypertension Brother     Social History   Social History  . Marital status: Married    Spouse name: N/A  . Number of children: N/A  . Years of education: N/A   Occupational History  . Not on file.   Social History Main Topics  . Smoking status: Former Research scientist (life sciences)  . Smokeless tobacco: Never Used  . Alcohol use  Yes     Comment: occasional   . Drug use: No  . Sexual activity: Yes   Other Topics Concern  . Not on file   Social History Narrative  . No narrative on file    Review of Systems: See HPI, otherwise negative ROS  Physical Exam: BP 135/70   Pulse 74   Temp 98.2 F (36.8 C) (Tympanic)   Resp 18   Ht 5\' 3"  (1.6 m)   Wt 200 lb (90.7 kg)   LMP  (LMP Unknown)   SpO2 98%   BMI 35.43 kg/m  General:   Alert,  pleasant and cooperative in NAD Head:  Normocephalic and atraumatic. Neck:  Supple; no masses or thyromegaly. Lungs:  Clear throughout to auscultation.    Heart:  Regular rate and rhythm. Abdomen:  Soft, nontender and nondistended. Normal bowel sounds, without guarding, and without rebound.   Neurologic:  Alert and  oriented x4;  grossly normal neurologically.  Impression/Plan: [place] is here for an colonoscopy to be performed for Screening colonoscopy average risk    Risks, benefits, limitations, and alternatives regarding  colonoscopy have been reviewed with the patient.  Questions have been answered.  All parties agreeable.   Jonathon Bellows, MD  12/23/2016,  8:02 AM

## 2016-12-23 NOTE — Anesthesia Post-op Follow-up Note (Signed)
Anesthesia QCDR form completed.        

## 2016-12-24 ENCOUNTER — Encounter: Payer: Self-pay | Admitting: Gastroenterology

## 2016-12-24 LAB — SURGICAL PATHOLOGY

## 2016-12-28 ENCOUNTER — Encounter: Payer: Self-pay | Admitting: Gastroenterology

## 2017-01-23 ENCOUNTER — Other Ambulatory Visit: Payer: Self-pay | Admitting: Family Medicine

## 2017-01-25 NOTE — Telephone Encounter (Signed)
Your patient 

## 2017-02-24 ENCOUNTER — Ambulatory Visit
Admission: EM | Admit: 2017-02-24 | Discharge: 2017-02-24 | Disposition: A | Discharge status: Home / Self Care | Payer: 59 | Attending: Family Medicine | Admitting: Family Medicine

## 2017-02-24 ENCOUNTER — Encounter: Payer: Self-pay | Admitting: *Deleted

## 2017-02-24 DIAGNOSIS — J0100 Acute maxillary sinusitis, unspecified: Secondary | ICD-10-CM | POA: Diagnosis not present

## 2017-02-24 DIAGNOSIS — R059 Cough, unspecified: Secondary | ICD-10-CM

## 2017-02-24 DIAGNOSIS — R05 Cough: Secondary | ICD-10-CM

## 2017-02-24 MED ORDER — BENZONATATE 100 MG PO CAPS: 100.0000 mg | ORAL_CAPSULE | Freq: Three times a day (TID) | ORAL | 0 refills | Status: DC | PRN

## 2017-02-24 MED ORDER — DOXYCYCLINE HYCLATE 100 MG PO CAPS: 100.0000 mg | ORAL_CAPSULE | Freq: Two times a day (BID) | ORAL | 0 refills | Status: DC

## 2017-02-24 MED ORDER — HYDROCOD POLST-CPM POLST ER 10-8 MG/5ML PO SUER: 5.0000 mL | Freq: Every evening | ORAL | 0 refills | Status: DC | PRN

## 2017-02-24 NOTE — ED Provider Notes (Signed)
[facility] ____________________________________________  Time seen: Approximately 1:01 PM  I have reviewed the triage vital signs and the nursing notes.   HISTORY  Chief Complaint Nasal Congestion   HPI Lorraine Melendez is a 56 y.o. female  Presenting for evaluation of 8 days of runny nose, nasal congestion, post nasal drainage, and sinus pressure. Reports also with cough, and reports cough worse at night, waking her up. Denies sore throat, known fevers, rash or abdominal complaints. Reports overall continues to eat and drink well. Denies known sick contacts. States feels similar to previous sinus infections. Denies associated chest pain or shortness of breath. States unrelieved with over the counter cough and congestion medications. Denies aggravating or alleviating factors.  Denies recent sickness. Denies recent antibiotic use.   Kathrine Haddock, NP: PCP No LMP recorded (lmp unknown). Patient is postmenopausal.   Past Medical History:  Diagnosis Date  . Anxiety   . Hyperlipidemia   . Hypertension     Patient Active Problem List   Diagnosis Date Noted  . Lichen sclerosus of female genitalia 07/11/2016  . Impaired fasting glucose 12/05/2014  . Hyperlipidemia 12/05/2014  . Hypertension 12/05/2014  . Acute anxiety 12/05/2014   Past Surgical History:  Procedure Laterality Date  . COLONOSCOPY WITH PROPOFOL N/A 12/23/2016   Procedure: COLONOSCOPY WITH PROPOFOL;  Surgeon: Jonathon Bellows, MD;  Location: [facility] ENDOSCOPY;  Service: Endoscopy;  Laterality: N/A;  . TUBAL LIGATION       No current facility-administered medications for this encounter.   Current Outpatient Prescriptions:  .  atorvastatin (LIPITOR) 20 MG tablet, TAKE 1 TABLET BY MOUTH  DAILY AT 6PM, Disp: 90 tablet, Rfl: 1 .  losartan (COZAAR) 50 MG tablet, TAKE 1 TABLET BY MOUTH  DAILY, Disp: 90 tablet, Rfl: 1 .  benzonatate (TESSALON PERLES) 100 MG capsule, Take 1 capsule (100 mg total) by mouth 3  (three) times daily as needed., Disp: 15 capsule, Rfl: 0 .  chlorpheniramine-HYDROcodone (TUSSIONEX PENNKINETIC ER) 10-8 MG/5ML SUER, Take 5 mLs by mouth at bedtime as needed for cough. do not drive or operate machinery while taking as can cause drowsiness., Disp: 75 mL, Rfl: 0 .  clobetasol ointment (TEMOVATE) 4.85 %, Apply 1 application topically 2 (two) times daily., Disp: 30 g, Rfl: 1 .  doxycycline (VIBRAMYCIN) 100 MG capsule, Take 1 capsule (100 mg total) by mouth 2 (two) times daily., Disp: 20 capsule, Rfl: 0 .  valACYclovir (VALTREX) 500 MG tablet, Take 1 tablet (500 mg total) by mouth 2 (two) times daily. (Patient not taking: Reported on 08/06/2016), Disp: 20 tablet, Rfl: 0  Allergies Patient has no known allergies.  Family History  Problem Relation Age of Onset  . Hypertension Mother   . Cancer Mother        breast and uterine  . Breast cancer Mother 50  . Hyperlipidemia Mother   . Hyperlipidemia Father   . Cancer Father        lung  . Hypertension Brother   . Hyperlipidemia Brother   . Cancer Maternal Grandmother        breast and skin  . Breast cancer Maternal Grandmother 60  . Hyperlipidemia Brother   . Hypertension Brother     Social History Social History  Substance Use Topics  . Smoking status: Former Research scientist (life sciences)  . Smokeless tobacco: Never Used  . Alcohol use Yes     Comment: occasional     Review of Systems Constitutional: No fever/chills ENT: No sore throat. Cardiovascular: Denies chest pain.  Respiratory: Denies shortness of breath. Gastrointestinal: No abdominal pain. No nausea, no vomiting.  Genitourinary: Negative for dysuria. Musculoskeletal: Negative for back pain. Skin: Negative for rash.  ____________________________________________   PHYSICAL EXAM:  VITAL SIGNS: ED Triage Vitals  Enc Vitals Group     BP 02/24/17 1215 (!) 122/59     Pulse Rate 02/24/17 1215 75     Resp 02/24/17 1215 12     Temp 02/24/17 1215 97.8 F (36.6 C)     Temp  Source 02/24/17 1215 Oral     SpO2 02/24/17 1215 96 %     Weight 02/24/17 1211 200 lb (90.7 kg)     Height 02/24/17 1211 5\' 3"  (1.6 m)     Head Circumference --      Peak Flow --      Pain Score --      Pain Loc --      Pain Edu? --      Excl. in [place]? --     Constitutional: Alert and oriented. Well appearing and in no acute distress. Eyes: Conjunctivae are normal.  Head: Atraumatic. Mild tenderness to palpation bilateral maxillary sinuses, no frontal sinus tenderness to palpation. No swelling. No erythema.   Ears: no erythema, normal TMs bilaterally.   Nose: nasal congestion with bilateral nasal turbinate erythema and edema.   Mouth/Throat: Mucous membranes are moist.  Oropharynx non-erythematous. No tonsillar swelling or exudate.  Neck: No stridor.  No cervical spine tenderness to palpation. Hematological/Lymphatic/Immunilogical: No cervical lymphadenopathy. Cardiovascular: Normal rate, regular rhythm. Grossly normal heart sounds.  Good peripheral circulation. Respiratory: Normal respiratory effort.  No retractions. No wheezes, rales or rhonchi. Good air movement. Dry intermittent cough noted in room.  Musculoskeletal: Steady gait.  Neurologic:  Normal speech and language. No gait instability.   Skin:  Skin is warm, dry and intact. No rash noted. Psychiatric: Mood and affect are normal. Speech and behavior are normal.  ___________________________________________   LABS (all labs ordered are listed, but only abnormal results are displayed)  Labs Reviewed - No data to display ____________________________________________   PROCEDURES Procedures     INITIAL IMPRESSION / ASSESSMENT AND PLAN / ED COURSE  Pertinent labs & imaging results that were available during my care of the patient were reviewed by me and considered in my medical decision making (see chart for details).  Well appearing patient, no acute distress. Discussed suspect recent viral upper respiratory infection,  also discussed sinusitis versus viral infection and antibiotic usage. Encouraged supportive care prior to initiation of antibiotic. Discussed in detail with patient supportive care, rest and fluids. Will treat when necessary Tessalon Perles, when necessary Tussionex and doxycycline. Discussed indication, risks and benefits of medications with patient.  Discussed follow up with Primary care physician this week. Discussed follow up and return parameters including no resolution or any worsening concerns. Patient verbalized understanding and agreed to plan.   ____________________________________________   FINAL CLINICAL IMPRESSION(S) / ED DIAGNOSES  Final diagnoses:  Acute maxillary sinusitis, recurrence not specified  Cough     Discharge Medication List as of 02/24/2017  1:08 PM    START taking these medications   Details  benzonatate (TESSALON PERLES) 100 MG capsule Take 1 capsule (100 mg total) by mouth 3 (three) times daily as needed., Starting Tue 02/24/2017, Normal    chlorpheniramine-HYDROcodone (TUSSIONEX PENNKINETIC ER) 10-8 MG/5ML SUER Take 5 mLs by mouth at bedtime as needed for cough. do not drive or operate machinery while taking as can cause drowsiness., Starting Tue  02/24/2017, Print    doxycycline (VIBRAMYCIN) 100 MG capsule Take 1 capsule (100 mg total) by mouth 2 (two) times daily., Starting Tue 02/24/2017, Normal        Note: This dictation was prepared with Dragon dictation along with smaller phrase technology. Any transcriptional errors that result from this process are unintentional.         Marylene Land, NP 02/24/17 1521

## 2017-02-24 NOTE — ED Triage Notes (Signed)
Started having cold like symptoms a week ago.

## 2017-02-24 NOTE — Discharge Instructions (Signed)
Take medication as prescribed. Rest. Drink plenty of fluids.  ° °Follow up with your primary care physician this week as needed. Return to Urgent care for new or worsening concerns.  ° °

## 2017-04-10 ENCOUNTER — Telehealth: Payer: Self-pay | Admitting: Unknown Physician Specialty

## 2017-04-10 ENCOUNTER — Other Ambulatory Visit: Payer: Self-pay | Admitting: Unknown Physician Specialty

## 2017-04-10 DIAGNOSIS — N904 Leukoplakia of vulva: Secondary | ICD-10-CM

## 2017-04-10 MED ORDER — CLOBETASOL PROPIONATE 0.05 % EX OINT: 1.0000 "application " | TOPICAL_OINTMENT | Freq: Two times a day (BID) | CUTANEOUS | 1 refills | Status: DC

## 2017-04-10 MED ORDER — HYDROCOD POLST-CPM POLST ER 10-8 MG/5ML PO SUER: 5.0000 mL | Freq: Every evening | ORAL | 0 refills | Status: DC | PRN

## 2017-04-10 NOTE — Telephone Encounter (Signed)
Copied from [place] [id number]. Topic: Quick Communication - See Telephone Encounter >> Apr 10, 2017  3:05 PM Vernona Rieger wrote: CRM for notification. See Telephone encounter for:  04/10/17. Needs a refill on the ointment CLOBETASOL.   Patient is OUT

## 2017-04-10 NOTE — Telephone Encounter (Signed)
Rx refill

## 2017-05-08 ENCOUNTER — Ambulatory Visit: Payer: 59 | Admitting: Unknown Physician Specialty

## 2017-05-11 ENCOUNTER — Encounter: Payer: Self-pay | Admitting: Unknown Physician Specialty

## 2017-05-11 ENCOUNTER — Ambulatory Visit: Payer: 59 | Admitting: Unknown Physician Specialty

## 2017-05-11 DIAGNOSIS — R7301 Impaired fasting glucose: Secondary | ICD-10-CM | POA: Diagnosis not present

## 2017-05-11 DIAGNOSIS — I10 Essential (primary) hypertension: Secondary | ICD-10-CM

## 2017-05-11 DIAGNOSIS — J309 Allergic rhinitis, unspecified: Secondary | ICD-10-CM | POA: Diagnosis not present

## 2017-05-11 DIAGNOSIS — E785 Hyperlipidemia, unspecified: Secondary | ICD-10-CM

## 2017-05-11 MED ORDER — MOMETASONE FUROATE 50 MCG/ACT NA SUSP: 2.0000 | Freq: Every day | NASAL | 12 refills | Status: DC

## 2017-05-11 MED ORDER — LOSARTAN POTASSIUM 50 MG PO TABS: 50.0000 mg | ORAL_TABLET | Freq: Every day | ORAL | 1 refills | Status: DC

## 2017-05-11 NOTE — Assessment & Plan Note (Signed)
Working on weight loss. She has changed her diet in the last 3 months

## 2017-05-11 NOTE — Progress Notes (Signed)
BP 133/84   Pulse 71   Temp 97.9 F (36.6 C) (Oral)   Wt 187 lb 9.6 oz (85.1 kg)   LMP  (LMP Unknown)   SpO2 97%   BMI 33.23 kg/m    Subjective:    Patient ID: Lorraine Melendez, female    DOB: 01-29-1961, 56 y.o.   MRN: [id number]  HPI: Lorraine Melendez is a 56 y.o. female  Chief Complaint  Patient presents with  . Hyperlipidemia  . Hypertension   Hypertension Using medications without difficulty Average home BPs   No problems or lightheadedness No chest pain with exertion or shortness of breath No Edema   Hyperlipidemia Using medications without problems: No Muscle aches  Diet compliance: Exercise:  Sinusitis  Chronicity: 4 weeks. The problem has been waxing and waning since onset. There has been no fever. She is experiencing no pain. Associated symptoms include congestion and coughing. Pertinent negatives include no diaphoresis, ear pain, headaches, hoarse voice, neck pain, shortness of breath, sinus pressure or sneezing. Past treatments include nothing.   Relevant past medical, surgical, family and social history reviewed and updated as indicated. Interim medical history since our last visit reviewed. Allergies and medications reviewed and updated.  Review of Systems  Constitutional: Negative for diaphoresis.  HENT: Positive for congestion. Negative for ear pain, hoarse voice, sinus pressure and sneezing.   Respiratory: Positive for cough. Negative for shortness of breath.   Musculoskeletal: Negative for neck pain.  Neurological: Negative for headaches.    Per HPI unless specifically indicated above     Objective:    BP 133/84   Pulse 71   Temp 97.9 F (36.6 C) (Oral)   Wt 187 lb 9.6 oz (85.1 kg)   LMP  (LMP Unknown)   SpO2 97%   BMI 33.23 kg/m   Wt Readings from Last 3 Encounters:  05/11/17 187 lb 9.6 oz (85.1 kg)  02/24/17 200 lb (90.7 kg)  12/23/16 200 lb (90.7 kg)    Physical Exam  Constitutional: She is oriented to person, place, and  time. She appears well-developed and well-nourished. No distress.  HENT:  Head: Normocephalic and atraumatic.  Eyes: Conjunctivae and lids are normal. Right eye exhibits no discharge. Left eye exhibits no discharge. No scleral icterus.  Neck: Normal range of motion. Neck supple. No JVD present. Carotid bruit is not present.  Cardiovascular: Normal rate, regular rhythm and normal heart sounds.  Pulmonary/Chest: Effort normal and breath sounds normal.  Abdominal: Normal appearance. There is no splenomegaly or hepatomegaly.  Musculoskeletal: Normal range of motion.  Neurological: She is alert and oriented to person, place, and time.  Skin: Skin is warm, dry and intact. No rash noted. No pallor.  Psychiatric: She has a normal mood and affect. Her behavior is normal. Judgment and thought content normal.     Assessment & Plan:   Problem List Items Addressed This Visit      Unprioritized   Allergic rhinitis    New problem.  Rx for Nasonex      Hyperlipidemia    Stable, continue present medications.        Relevant Medications   losartan (COZAAR) 50 MG tablet   Hypertension    Stable, continue present medications.        Relevant Medications   losartan (COZAAR) 50 MG tablet   Impaired fasting glucose    Working on weight loss. She has changed her diet in the last 3 months  Follow up plan: Return in about 6 months (around 11/09/2017).

## 2017-05-11 NOTE — Assessment & Plan Note (Signed)
Stable, continue present medications.   

## 2017-05-11 NOTE — Assessment & Plan Note (Signed)
New problem.  Rx for Nasonex

## 2017-07-06 ENCOUNTER — Other Ambulatory Visit: Payer: Self-pay | Admitting: Unknown Physician Specialty

## 2017-07-09 DIAGNOSIS — L570 Actinic keratosis: Secondary | ICD-10-CM | POA: Diagnosis not present

## 2017-07-09 DIAGNOSIS — L738 Other specified follicular disorders: Secondary | ICD-10-CM | POA: Diagnosis not present

## 2017-07-09 DIAGNOSIS — L578 Other skin changes due to chronic exposure to nonionizing radiation: Secondary | ICD-10-CM | POA: Diagnosis not present

## 2017-07-09 DIAGNOSIS — L720 Epidermal cyst: Secondary | ICD-10-CM | POA: Diagnosis not present

## 2017-08-06 DIAGNOSIS — L738 Other specified follicular disorders: Secondary | ICD-10-CM | POA: Diagnosis not present

## 2017-10-14 ENCOUNTER — Other Ambulatory Visit: Payer: Self-pay | Admitting: Unknown Physician Specialty

## 2017-11-09 ENCOUNTER — Encounter: Payer: Self-pay | Admitting: Unknown Physician Specialty

## 2017-11-09 ENCOUNTER — Ambulatory Visit (INDEPENDENT_AMBULATORY_CARE_PROVIDER_SITE_OTHER): Payer: 59 | Admitting: Unknown Physician Specialty

## 2017-11-09 VITALS — BP 129/82 | HR 71 | Temp 98.1°F | Ht 62.5 in | Wt 189.6 lb

## 2017-11-09 DIAGNOSIS — N904 Leukoplakia of vulva: Secondary | ICD-10-CM | POA: Diagnosis not present

## 2017-11-09 DIAGNOSIS — Z0000 Encounter for general adult medical examination without abnormal findings: Secondary | ICD-10-CM

## 2017-11-09 DIAGNOSIS — I10 Essential (primary) hypertension: Secondary | ICD-10-CM | POA: Diagnosis not present

## 2017-11-09 DIAGNOSIS — E669 Obesity, unspecified: Secondary | ICD-10-CM | POA: Insufficient documentation

## 2017-11-09 DIAGNOSIS — E785 Hyperlipidemia, unspecified: Secondary | ICD-10-CM

## 2017-11-09 NOTE — Assessment & Plan Note (Signed)
On Atorvastatin.  Check cholesterol todayt

## 2017-11-09 NOTE — Assessment & Plan Note (Signed)
Lost a lot of weight with diet and exercise.  Able to maintain that weight loss.

## 2017-11-09 NOTE — Patient Instructions (Signed)
Please do call to schedule your mammogram; the number to schedule one at either [facility] or [facility] is [phone]   

## 2017-11-09 NOTE — Assessment & Plan Note (Signed)
Much improved.  Using Temovate about 3 times/week

## 2017-11-09 NOTE — Progress Notes (Addendum)
BP 129/82   Pulse 71   Temp 98.1 F (36.7 C) (Oral)   Ht 5' 2.5" (1.588 m)   Wt 189 lb 9.6 oz (86 kg)   LMP  (LMP Unknown)   SpO2 98%   BMI 34.13 kg/m    Subjective:    Patient ID: Lorraine Melendez, female    DOB: 11-Dec-1960, 57 y.o.   MRN: [id number]  HPI: Lorraine Melendez is a 57 y.o. female  Chief Complaint  Patient presents with  . Annual Exam   Hypertension Using medications without difficulty Average home BPs: not checking  No problems or lightheadedness No chest pain with exertion or shortness of breath No Edema  Hyperlipidemia Using medications without problems: No Muscle aches  Diet compliance: "al little bit better."  Lost 25 pounds and now maintaining Exercise: Starting to swim  Lichen sclerosus Using cream 3 times/week.    Relevant past medical, surgical, family and social history reviewed and updated as indicated. Interim medical history since our last visit reviewed. Allergies and medications reviewed and updated.  Review of Systems  Constitutional: Negative.   HENT: Negative.   Eyes: Negative.   Respiratory: Negative.   Cardiovascular: Negative.   Gastrointestinal: Negative.   Endocrine: Negative.   Genitourinary: Negative.   Musculoskeletal: Negative.   Skin: Negative.   Allergic/Immunologic: Negative.   Neurological: Negative.   Hematological: Negative.   Psychiatric/Behavioral: Negative.     Per HPI unless specifically indicated above     Objective:    BP 129/82   Pulse 71   Temp 98.1 F (36.7 C) (Oral)   Ht 5' 2.5" (1.588 m)   Wt 189 lb 9.6 oz (86 kg)   LMP  (LMP Unknown)   SpO2 98%   BMI 34.13 kg/m   Wt Readings from Last 3 Encounters:  11/09/17 189 lb 9.6 oz (86 kg)  05/11/17 187 lb 9.6 oz (85.1 kg)  02/24/17 200 lb (90.7 kg)    Physical Exam  Constitutional: She is oriented to person, place, and time. She appears well-developed and well-nourished.  HENT:  Head: Normocephalic and atraumatic.  Eyes: Pupils are  equal, round, and reactive to light. Right eye exhibits no discharge. Left eye exhibits no discharge. No scleral icterus.  Neck: Normal range of motion. Neck supple. Carotid bruit is not present. No thyromegaly present.  Cardiovascular: Normal rate, regular rhythm and normal heart sounds. Exam reveals no gallop and no friction rub.  No murmur heard. Pulmonary/Chest: Effort normal and breath sounds normal. No respiratory distress. She has no wheezes. She has no rales. No breast tenderness or discharge.  Abdominal: Soft. Bowel sounds are normal. There is no tenderness. There is no rebound.  Genitourinary: Vagina normal and uterus normal. No breast tenderness or discharge. Cervix exhibits no motion tenderness, no discharge and no friability. Right adnexum displays no mass, no tenderness and no fullness. Left adnexum displays no mass, no tenderness and no fullness.  Musculoskeletal: Normal range of motion.  Lymphadenopathy:    She has no cervical adenopathy.  Neurological: She is alert and oriented to person, place, and time.  Skin: Skin is warm, dry and intact. No rash noted.  Psychiatric: She has a normal mood and affect. Her speech is normal and behavior is normal. Judgment and thought content normal. Cognition and memory are normal.      Assessment & Plan:   Problem List Items Addressed This Visit      Unprioritized   Hyperlipidemia    On Atorvastatin.  Check cholesterol todayt      Hypertension    Stable, continue present medications.        Relevant Orders   CBC with Differential/Platelet   Comprehensive metabolic panel   Lipid Panel w/o Chol/HDL Ratio   Lichen sclerosus of female genitalia    Much improved.  Using Temovate about 3 times/week      Obesity (BMI 30-39.9)    Lost a lot of weight with diet and exercise.  Able to maintain that weight loss.         Other Visit Diagnoses    Annual physical exam    -  Primary   Relevant Orders   IGP, Aptima HPV, rfx 16/18,45    TSH   VITAMIN D 25 Hydroxy (Vit-D Deficiency, Fractures)   MM DIGITAL SCREENING BILATERAL       Follow up plan: Return in about 6 months (around 05/11/2018).

## 2017-11-09 NOTE — Assessment & Plan Note (Signed)
Stable, continue present medications.   

## 2017-11-09 NOTE — Addendum Note (Signed)
Addended by: Kathrine Haddock on: 11/09/2017 09:30 AM   Modules accepted: Orders

## 2017-11-10 ENCOUNTER — Encounter: Payer: Self-pay | Admitting: Unknown Physician Specialty

## 2017-11-10 LAB — LIPID PANEL W/O CHOL/HDL RATIO
Cholesterol, Total: 159 mg/dL (ref 100–199)
HDL: 46 mg/dL (ref >39)
LDL Calculated: 81 mg/dL (ref 0–99)
Triglycerides: 161 mg/dL — ABNORMAL HIGH (ref 0–149)
VLDL Cholesterol Cal: 32 mg/dL (ref 5–40)

## 2017-11-10 LAB — COMPREHENSIVE METABOLIC PANEL
A/G RATIO: 1.8 (ref 1.2–2.2)
ALT: 37 IU/L — AB (ref 0–32)
AST: 25 IU/L (ref 0–40)
Albumin: 4.6 g/dL (ref 3.5–5.5)
Alkaline Phosphatase: 85 IU/L (ref 39–117)
BILIRUBIN TOTAL: 0.4 mg/dL (ref 0.0–1.2)
BUN/Creatinine Ratio: 17 (ref 9–23)
BUN: 14 mg/dL (ref 6–24)
CHLORIDE: 103 mmol/L (ref 96–106)
CO2: 23 mmol/L (ref 20–29)
Calcium: 9.5 mg/dL (ref 8.7–10.2)
Creatinine, Ser: 0.82 mg/dL (ref 0.57–1.00)
GFR calc non Af Amer: 79 mL/min/{1.73_m2} (ref >59)
GFR, EST AFRICAN AMERICAN: 91 mL/min/{1.73_m2} (ref >59)
Globulin, Total: 2.6 g/dL (ref 1.5–4.5)
Glucose: 87 mg/dL (ref 65–99)
POTASSIUM: 4.4 mmol/L (ref 3.5–5.2)
Sodium: 142 mmol/L (ref 134–144)
Total Protein: 7.2 g/dL (ref 6.0–8.5)

## 2017-11-10 LAB — CBC WITH DIFFERENTIAL/PLATELET
Basophils Absolute: 0 10*3/uL (ref 0.0–0.2)
Basos: 0 %
EOS (ABSOLUTE): 0.2 10*3/uL (ref 0.0–0.4)
Eos: 3 %
Hematocrit: 44.6 % (ref 34.0–46.6)
Hemoglobin: 14.3 g/dL (ref 11.1–15.9)
Immature Grans (Abs): 0 10*3/uL (ref 0.0–0.1)
Immature Granulocytes: 0 %
LYMPHS ABS: 1.8 10*3/uL (ref 0.7–3.1)
Lymphs: 25 %
MCH: 30.2 pg (ref 26.6–33.0)
MCHC: 32.1 g/dL (ref 31.5–35.7)
MCV: 94 fL (ref 79–97)
MONOS ABS: 0.5 10*3/uL (ref 0.1–0.9)
Monocytes: 7 %
NEUTROS ABS: 4.7 10*3/uL (ref 1.4–7.0)
Neutrophils: 65 %
PLATELETS: 278 10*3/uL (ref 150–450)
RBC: 4.73 x10E6/uL (ref 3.77–5.28)
RDW: 13.5 % (ref 12.3–15.4)
WBC: 7.2 10*3/uL (ref 3.4–10.8)

## 2017-11-10 LAB — TSH: TSH: 2.72 u[IU]/mL (ref 0.450–4.500)

## 2017-11-10 LAB — VITAMIN D 25 HYDROXY (VIT D DEFICIENCY, FRACTURES): VIT D 25 HYDROXY: 30.6 ng/mL (ref 30.0–100.0)

## 2017-11-12 LAB — IGP, APTIMA HPV, RFX 16/18,45
HPV Aptima: NEGATIVE
PAP SMEAR COMMENT: 0

## 2017-12-10 ENCOUNTER — Other Ambulatory Visit: Payer: Self-pay | Admitting: Unknown Physician Specialty

## 2018-03-04 ENCOUNTER — Other Ambulatory Visit: Payer: Self-pay | Admitting: Unknown Physician Specialty

## 2018-04-30 ENCOUNTER — Other Ambulatory Visit: Payer: Self-pay | Admitting: Unknown Physician Specialty

## 2018-05-03 NOTE — Telephone Encounter (Signed)
Requested Prescriptions  Pending Prescriptions Disp Refills  . atorvastatin (LIPITOR) 20 MG tablet [Pharmacy Med Name: ATORVASTATIN  20MG   TAB] 90 tablet 1    Sig: TAKE 1 TABLET BY MOUTH  DAILY AT 6PM     Cardiovascular:  Antilipid - Statins Failed - 04/30/2018  9:21 PM      Failed - Triglycerides in normal range and within 360 days    Triglycerides  Date Value Ref Range Status  11/09/2017 161 (H) 0 - 149 mg/dL Final   Triglycerides Piccolo,Waived  Date Value Ref Range Status  10/03/2015 159 (H) <150 mg/dL Final    Comment:                            Normal                   <150                         Borderline High     150 - 199                         High                200 - 499                         Very High                >499          Passed - Total Cholesterol in normal range and within 360 days    Cholesterol, Total  Date Value Ref Range Status  11/09/2017 159 100 - 199 mg/dL Final   Cholesterol Piccolo, Waived  Date Value Ref Range Status  10/03/2015 138 <200 mg/dL Final    Comment:                            Desirable                <200                         Borderline High      200- 239                         High                     >239          Passed - LDL in normal range and within 360 days    LDL Calculated  Date Value Ref Range Status  11/09/2017 81 0 - 99 mg/dL Final         Passed - HDL in normal range and within 360 days    HDL  Date Value Ref Range Status  11/09/2017 46 >39 mg/dL Final         Passed - Patient is not pregnant      Passed - Valid encounter within last 12 months    Recent Outpatient Visits          5 months ago Annual physical exam   [facility] Kathrine Haddock, NP   11 months ago Essential hypertension   [facility] Kathrine Haddock, NP  1 year ago Need for Tdap vaccination   [facility] Kathrine Haddock, NP   1 year ago Lichen sclerosus of female genitalia   [facility] Kathrine Haddock, NP   1 year ago Lichen sclerosus of female genitalia   [facility] Kathrine Haddock, NP

## 2018-05-14 ENCOUNTER — Ambulatory Visit: Payer: 59 | Admitting: Unknown Physician Specialty

## 2018-06-22 ENCOUNTER — Other Ambulatory Visit: Payer: Self-pay

## 2018-06-22 ENCOUNTER — Encounter: Payer: Self-pay | Admitting: Emergency Medicine

## 2018-06-22 ENCOUNTER — Ambulatory Visit
Admission: EM | Admit: 2018-06-22 | Discharge: 2018-06-22 | Disposition: A | Discharge status: Home / Self Care | Payer: 59 | Attending: Family Medicine | Admitting: Family Medicine

## 2018-06-22 DIAGNOSIS — Z87891 Personal history of nicotine dependence: Secondary | ICD-10-CM

## 2018-06-22 DIAGNOSIS — J320 Chronic maxillary sinusitis: Secondary | ICD-10-CM | POA: Diagnosis not present

## 2018-06-22 MED ORDER — DOXYCYCLINE HYCLATE 100 MG PO CAPS: 100.0000 mg | ORAL_CAPSULE | Freq: Two times a day (BID) | ORAL | 0 refills | Status: DC

## 2018-06-22 NOTE — ED Triage Notes (Signed)
Patient c/o sinus congestion and pain and HAs for the past month.

## 2018-06-22 NOTE — ED Provider Notes (Signed)
[facility]    CSN: [id number] Arrival date & time: 06/22/18  1609  History   Chief Complaint Chief Complaint  Patient presents with  . Sinus Problem   HPI  58 year old female presents with sinus pain/pressure/congestion.  1.5 month history of sinus pressure, pain, congestion.  Primarily in the nasal and maxillary regions.  No fever.  She has been taking an antihistamine and using Nasonex without improvement.  Symptoms continue to persist.  No known exacerbating or relieving factors.  No reports of purulent nasal discharge.  No other respiratory symptoms.  No other complaints.  Past Medical History:  Diagnosis Date  . Anxiety   . Hyperlipidemia   . Hypertension     Patient Active Problem List   Diagnosis Date Noted  . Obesity (BMI 30-39.9) 11/09/2017  . Allergic rhinitis 05/11/2017  . Lichen sclerosus of female genitalia 07/11/2016  . Impaired fasting glucose 12/05/2014  . Hyperlipidemia 12/05/2014  . Hypertension 12/05/2014    Past Surgical History:  Procedure Laterality Date  . COLONOSCOPY WITH PROPOFOL N/A 12/23/2016   Procedure: COLONOSCOPY WITH PROPOFOL;  Surgeon: Jonathon Bellows, MD;  Location: [facility] ENDOSCOPY;  Service: Endoscopy;  Laterality: N/A;  . TUBAL LIGATION      OB History   No obstetric history on file.      Home Medications    Prior to Admission medications   Medication Sig Start Date End Date Taking? Authorizing Provider  atorvastatin (LIPITOR) 20 MG tablet TAKE 1 TABLET BY MOUTH  DAILY AT 6PM 05/03/18  Yes Kathrine Haddock, NP  clobetasol ointment (TEMOVATE) 8.67 % Apply 1 application topically 2 (two) times daily. 04/10/17  Yes Kathrine Haddock, NP  losartan (COZAAR) 50 MG tablet TAKE 1 TABLET BY MOUTH  DAILY 03/05/18  Yes Kathrine Haddock, NP  mometasone (NASONEX) 50 MCG/ACT nasal spray Place 2 sprays into the nose daily. 05/11/17  Yes Kathrine Haddock, NP  Cholecalciferol (VITAMIN D3) POWD by Does not apply route. 1 teaspoon daily    [provider]  Creatine POWD Take by mouth. 1 teaspoon daily    [provider]  doxycycline (VIBRAMYCIN) 100 MG capsule Take 1 capsule (100 mg total) by mouth 2 (two) times daily. 06/22/18   Coral Spikes, DO  Methylsulfonylmethane (MSM) POWD Take by mouth. 3 teaspoon daily    [provider]    Family History Family History  Problem Relation Age of Onset  . Hypertension Mother   . Cancer Mother        breast and uterine  . Breast cancer Mother 17  . Hyperlipidemia Mother   . Hyperlipidemia Father   . Cancer Father        lung  . Hypertension Brother   . Hyperlipidemia Brother   . Cancer Maternal Grandmother        breast and skin  . Breast cancer Maternal Grandmother 60  . Hyperlipidemia Brother   . Hypertension Brother     Social History Social History   Tobacco Use  . Smoking status: Former Research scientist (life sciences)  . Smokeless tobacco: Never Used  Substance Use Topics  . Alcohol use: Yes    Comment: occasional   . Drug use: No     Allergies   Patient has no known allergies.   Review of Systems Review of Systems  Constitutional: Negative for fever.  HENT: Positive for congestion, sinus pressure and sinus pain.    Physical Exam Triage Vital Signs ED Triage Vitals  Enc Vitals Group  BP 06/22/18 1623 (!) 111/54     Pulse Rate 06/22/18 1623 (!) 108     Resp 06/22/18 1623 16     Temp 06/22/18 1623 98.5 F (36.9 C)     Temp Source 06/22/18 1623 Oral     SpO2 06/22/18 1623 97 %     Weight 06/22/18 1620 192 lb (87.1 kg)     Height 06/22/18 1620 5\' 3"  (1.6 m)     Head Circumference --      Peak Flow --      Pain Score 06/22/18 1620 1     Pain Loc --      Pain Edu? --      Excl. in [place]? --    Updated Vital Signs BP (!) 111/54 (BP Location: Left Arm)   Pulse (!) 108   Temp 98.5 F (36.9 C) (Oral)   Resp 16   Ht 5\' 3"  (1.6 m)   Wt 87.1 kg   LMP  (LMP Unknown)   SpO2 97%   BMI 34.01 kg/m   Visual Acuity Right Eye Distance:   Left Eye  Distance:   Bilateral Distance:    Right Eye Near:   Left Eye Near:    Bilateral Near:     Physical Exam Vitals signs and nursing note reviewed.  Constitutional:      General: She is not in acute distress. HENT:     Head: Normocephalic and atraumatic.     Right Ear: Tympanic membrane normal.     Left Ear: Tympanic membrane normal.     Nose: Nose normal.     Comments: Maxillary sinus tenderness to palpation. Cardiovascular:     Rate and Rhythm: Normal rate and regular rhythm.  Pulmonary:     Effort: Pulmonary effort is normal.     Breath sounds: No wheezing, rhonchi or rales.  Neurological:     Mental Status: She is alert.  Psychiatric:        Mood and Affect: Mood normal.        Behavior: Behavior normal.    UC Treatments / Results  Labs (all labs ordered are listed, but only abnormal results are displayed) Labs Reviewed - No data to display  EKG None  Radiology No results found.  Procedures Procedures (including critical care time)  Medications Ordered in UC Medications - No data to display  Initial Impression / Assessment and Plan / UC Course  I have reviewed the triage vital signs and the nursing notes.  Pertinent labs & imaging results that were available during my care of the patient were reviewed by me and considered in my medical decision making (see chart for details).    58 year old female presents with persistent sinus pain/pressure/congestion. Treating with doxy.  Final Clinical Impressions(s) / UC Diagnoses   Final diagnoses:  Maxillary sinusitis, unspecified chronicity   Discharge Instructions   None    ED Prescriptions    Medication Sig Dispense Auth. Provider   doxycycline (VIBRAMYCIN) 100 MG capsule Take 1 capsule (100 mg total) by mouth 2 (two) times daily. 14 capsule Coral Spikes, DO     Controlled Substance Prescriptions [place] Controlled Substance Registry consulted? Not Applicable   Coral Spikes, DO 06/22/18 1756

## 2018-06-23 ENCOUNTER — Encounter: Payer: Self-pay | Admitting: Nurse Practitioner

## 2018-06-23 ENCOUNTER — Other Ambulatory Visit: Payer: Self-pay

## 2018-06-23 ENCOUNTER — Ambulatory Visit: Payer: 59 | Admitting: Nurse Practitioner

## 2018-06-23 VITALS — BP 124/81 | HR 86 | Temp 98.3°F | Ht 63.0 in | Wt 194.0 lb

## 2018-06-23 DIAGNOSIS — Z1231 Encounter for screening mammogram for malignant neoplasm of breast: Secondary | ICD-10-CM

## 2018-06-23 DIAGNOSIS — N904 Leukoplakia of vulva: Secondary | ICD-10-CM

## 2018-06-23 DIAGNOSIS — I10 Essential (primary) hypertension: Secondary | ICD-10-CM

## 2018-06-23 DIAGNOSIS — E785 Hyperlipidemia, unspecified: Secondary | ICD-10-CM

## 2018-06-23 DIAGNOSIS — E669 Obesity, unspecified: Secondary | ICD-10-CM | POA: Diagnosis not present

## 2018-06-23 MED ORDER — MOMETASONE FUROATE 50 MCG/ACT NA SUSP: 2.0000 | Freq: Every day | NASAL | 12 refills | Status: DC

## 2018-06-23 MED ORDER — CLOBETASOL PROPIONATE 0.05 % EX OINT: 1.0000 "application " | TOPICAL_OINTMENT | Freq: Two times a day (BID) | CUTANEOUS | 1 refills | Status: DC

## 2018-06-23 NOTE — Assessment & Plan Note (Signed)
Chronic, stable.  Continue current medication regimen. 

## 2018-06-23 NOTE — Progress Notes (Signed)
BP 124/81   Pulse 86 Comment: apical  Temp 98.3 F (36.8 C) (Oral)   Ht 5\' 3"  (1.6 m)   Wt 194 lb (88 kg)   LMP  (LMP Unknown)   SpO2 95%   BMI 34.37 kg/m    Subjective:    Patient ID: Lorraine Melendez, female    DOB: 03/17/1961, 58 y.o.   MRN: [id number]  HPI: Lorraine Melendez is a 58 y.o. female presents for follow-up  Chief Complaint  Patient presents with  . Hypertension    78m f/u  . Hyperlipidemia   HYPERTENSION / HYPERLIPIDEMIA Currently on Losartan and Atorvastatin. Satisfied with current treatment? yes Duration of hypertension: chronic BP monitoring frequency: rarely BP range: 120-130/70-80 range BP medication side effects: no Past BP meds: Lisinopril Duration of hyperlipidemia: chronic Cholesterol medication side effects: no Cholesterol supplements: none Medication compliance: excellent compliance Aspirin: no Recent stressors: no Recurrent headaches: no Visual changes: no Palpitations: no Dyspnea: no Chest pain: no Lower extremity edema: no Dizzy/lightheaded: no  Relevant past medical, surgical, family and social history reviewed and updated as indicated. Interim medical history since our last visit reviewed. Allergies and medications reviewed and updated.  Review of Systems  Constitutional: Negative for activity change, appetite change, diaphoresis, fatigue and fever.  Respiratory: Negative for cough, chest tightness and shortness of breath.   Cardiovascular: Negative for chest pain, palpitations and leg swelling.  Gastrointestinal: Negative for abdominal distention, abdominal pain, constipation, diarrhea, nausea and vomiting.  Endocrine: Negative for cold intolerance, heat intolerance, polydipsia, polyphagia and polyuria.  Neurological: Negative for dizziness, syncope, weakness, light-headedness, numbness and headaches.  Psychiatric/Behavioral: Negative.     Per HPI unless specifically indicated above     Objective:    BP 124/81    Pulse 86 Comment: apical  Temp 98.3 F (36.8 C) (Oral)   Ht 5\' 3"  (1.6 m)   Wt 194 lb (88 kg)   LMP  (LMP Unknown)   SpO2 95%   BMI 34.37 kg/m   Wt Readings from Last 3 Encounters:  06/23/18 194 lb (88 kg)  06/22/18 192 lb (87.1 kg)  11/09/17 189 lb 9.6 oz (86 kg)    Physical Exam Vitals signs and nursing note reviewed.  Constitutional:      General: She is awake.     Appearance: She is well-developed.  HENT:     Head: Normocephalic.     Right Ear: Hearing normal.     Left Ear: Hearing normal.     Nose: Nose normal.     Mouth/Throat:     Mouth: Mucous membranes are moist.  Eyes:     General: Lids are normal.        Right eye: No discharge.        Left eye: No discharge.     Conjunctiva/sclera: Conjunctivae normal.     Pupils: Pupils are equal, round, and reactive to light.  Neck:     Musculoskeletal: Normal range of motion and neck supple.     Thyroid: No thyromegaly.     Vascular: No carotid bruit or JVD.  Cardiovascular:     Rate and Rhythm: Normal rate and regular rhythm.     Heart sounds: Normal heart sounds. No murmur. No gallop.   Pulmonary:     Effort: Pulmonary effort is normal.     Breath sounds: Normal breath sounds.  Abdominal:     General: Bowel sounds are normal.     Palpations: Abdomen is soft. There is no  hepatomegaly or splenomegaly.  Musculoskeletal:     Right lower leg: No edema.     Left lower leg: No edema.  Lymphadenopathy:     Cervical: No cervical adenopathy.  Skin:    General: Skin is warm and dry.  Neurological:     Mental Status: She is alert and oriented to person, place, and time.  Psychiatric:        Attention and Perception: Attention normal.        Mood and Affect: Mood normal.        Behavior: Behavior normal. Behavior is cooperative.        Thought Content: Thought content normal.        Judgment: Judgment normal.     Results for orders placed or performed in visit on 11/09/17  CBC with Differential/Platelet  Result  Value Ref Range   WBC 7.2 3.4 - 10.8 x10E3/uL   RBC 4.73 3.77 - 5.28 x10E6/uL   Hemoglobin 14.3 11.1 - 15.9 g/dL   Hematocrit 44.6 34.0 - 46.6 %   MCV 94 79 - 97 fL   MCH 30.2 26.6 - 33.0 pg   MCHC 32.1 31.5 - 35.7 g/dL   RDW 13.5 12.3 - 15.4 %   Platelets 278 150 - 450 x10E3/uL   Neutrophils 65 Not Estab. %   Lymphs 25 Not Estab. %   Monocytes 7 Not Estab. %   Eos 3 Not Estab. %   Basos 0 Not Estab. %   Neutrophils Absolute 4.7 1.4 - 7.0 x10E3/uL   Lymphocytes Absolute 1.8 0.7 - 3.1 x10E3/uL   Monocytes Absolute 0.5 0.1 - 0.9 x10E3/uL   EOS (ABSOLUTE) 0.2 0.0 - 0.4 x10E3/uL   Basophils Absolute 0.0 0.0 - 0.2 x10E3/uL   Immature Granulocytes 0 Not Estab. %   Immature Grans (Abs) 0.0 0.0 - 0.1 x10E3/uL  Comprehensive metabolic panel  Result Value Ref Range   Glucose 87 65 - 99 mg/dL   BUN 14 6 - 24 mg/dL   Creatinine, Ser 0.82 0.57 - 1.00 mg/dL   GFR calc non Af Amer 79 >59 mL/min/1.73   GFR calc Af Amer 91 >59 mL/min/1.73   BUN/Creatinine Ratio 17 9 - 23   Sodium 142 134 - 144 mmol/L   Potassium 4.4 3.5 - 5.2 mmol/L   Chloride 103 96 - 106 mmol/L   CO2 23 20 - 29 mmol/L   Calcium 9.5 8.7 - 10.2 mg/dL   Total Protein 7.2 6.0 - 8.5 g/dL   Albumin 4.6 3.5 - 5.5 g/dL   Globulin, Total 2.6 1.5 - 4.5 g/dL   Albumin/Globulin Ratio 1.8 1.2 - 2.2   Bilirubin Total 0.4 0.0 - 1.2 mg/dL   Alkaline Phosphatase 85 39 - 117 IU/L   AST 25 0 - 40 IU/L   ALT 37 (H) 0 - 32 IU/L  Lipid Panel w/o Chol/HDL Ratio  Result Value Ref Range   Cholesterol, Total 159 100 - 199 mg/dL   Triglycerides 161 (H) 0 - 149 mg/dL   HDL 46 >39 mg/dL   VLDL Cholesterol Cal 32 5 - 40 mg/dL   LDL Calculated 81 0 - 99 mg/dL  TSH  Result Value Ref Range   TSH 2.720 0.450 - 4.500 uIU/mL  VITAMIN D 25 Hydroxy (Vit-D Deficiency, Fractures)  Result Value Ref Range   Vit D, 25-Hydroxy 30.6 30.0 - 100.0 ng/mL  IGP, Aptima HPV, rfx 16/18,45  Result Value Ref Range   DIAGNOSIS: Comment    Specimen adequacy:  Comment    Clinician Provided ICD10 Comment    Performed by: Comment    PAP Smear Comment .    Note: Comment    Test Methodology Comment    HPV Aptima Negative Negative      Assessment & Plan:   Problem List Items Addressed This Visit      Cardiovascular and Mediastinum   Hypertension - Primary    Chronic, stable.  Continue current medication regimen.          Musculoskeletal and Integument   Lichen sclerosus of female genitalia     Other   Hyperlipidemia    Chronic, stable.  Continue current medication regimen.  Lipid panel next visit.      Obesity (BMI 30-39.9)    Has gained some, recommended focus on diet and regular exercise (30 minutes x 5 days a week).       Other Visit Diagnoses    Screening mammogram, encounter for       Relevant Orders   MM DIGITAL SCREENING BILATERAL       Follow up plan: Return in about 5 months (around 11/22/2018) for Physical Exam.

## 2018-06-23 NOTE — Assessment & Plan Note (Signed)
Has gained some, recommended focus on diet and regular exercise (30 minutes x 5 days a week).

## 2018-06-23 NOTE — Patient Instructions (Signed)
DASH Eating Plan  DASH stands for "Dietary Approaches to Stop Hypertension." The DASH eating plan is a healthy eating plan that has been shown to reduce high blood pressure (hypertension). It may also reduce your risk for type 2 diabetes, heart disease, and stroke. The DASH eating plan may also help with weight loss.  What are tips for following this plan?    General guidelines   Avoid eating more than 2,300 mg (milligrams) of salt (sodium) a day. If you have hypertension, you may need to reduce your sodium intake to 1,500 mg a day.   Limit alcohol intake to no more than 1 drink a day for nonpregnant women and 2 drinks a day for men. One drink equals 12 oz of beer, 5 oz of wine, or 1 oz of hard liquor.   Work with your health care provider to maintain a healthy body weight or to lose weight. Ask what an ideal weight is for you.   Get at least 30 minutes of exercise that causes your heart to beat faster (aerobic exercise) most days of the week. Activities may include walking, swimming, or biking.   Work with your health care provider or diet and nutrition specialist (dietitian) to adjust your eating plan to your individual calorie needs.  Reading food labels     Check food labels for the amount of sodium per serving. Choose foods with less than 5 percent of the Daily Value of sodium. Generally, foods with less than 300 mg of sodium per serving fit into this eating plan.   To find whole grains, look for the word "whole" as the first word in the ingredient list.  Shopping   Buy products labeled as "low-sodium" or "no salt added."   Buy fresh foods. Avoid canned foods and premade or frozen meals.  Cooking   Avoid adding salt when cooking. Use salt-free seasonings or herbs instead of table salt or sea salt. Check with your health care provider or pharmacist before using salt substitutes.   Do not fry foods. Cook foods using healthy methods such as baking, boiling, grilling, and broiling instead.   Cook with  heart-healthy oils, such as olive, canola, soybean, or sunflower oil.  Meal planning   Eat a balanced diet that includes:  ? 5 or more servings of fruits and vegetables each day. At each meal, try to fill half of your plate with fruits and vegetables.  ? Up to 6-8 servings of whole grains each day.  ? Less than 6 oz of lean meat, poultry, or fish each day. A 3-oz serving of meat is about the same size as a deck of cards. One egg equals 1 oz.  ? 2 servings of low-fat dairy each day.  ? A serving of nuts, seeds, or beans 5 times each week.  ? Heart-healthy fats. Healthy fats called Omega-3 fatty acids are found in foods such as flaxseeds and coldwater fish, like sardines, salmon, and mackerel.   Limit how much you eat of the following:  ? Canned or prepackaged foods.  ? Food that is high in trans fat, such as fried foods.  ? Food that is high in saturated fat, such as fatty meat.  ? Sweets, desserts, sugary drinks, and other foods with added sugar.  ? Full-fat dairy products.   Do not salt foods before eating.   Try to eat at least 2 vegetarian meals each week.   Eat more home-cooked food and less restaurant, buffet, and fast food.     When eating at a restaurant, ask that your food be prepared with less salt or no salt, if possible.  What foods are recommended?  The items listed may not be a complete list. Talk with your dietitian about what dietary choices are best for you.  Grains  Whole-grain or whole-wheat bread. Whole-grain or whole-wheat pasta. Brown rice. Oatmeal. Quinoa. Bulgur. Whole-grain and low-sodium cereals. Pita bread. Low-fat, low-sodium crackers. Whole-wheat flour tortillas.  Vegetables  Fresh or frozen vegetables (raw, steamed, roasted, or grilled). Low-sodium or reduced-sodium tomato and vegetable juice. Low-sodium or reduced-sodium tomato sauce and tomato paste. Low-sodium or reduced-sodium canned vegetables.  Fruits  All fresh, dried, or frozen fruit. Canned fruit in natural juice (without  added sugar).  Meat and other protein foods  Skinless chicken or turkey. Ground chicken or turkey. Pork with fat trimmed off. Fish and seafood. Egg whites. Dried beans, peas, or lentils. Unsalted nuts, nut butters, and seeds. Unsalted canned beans. Lean cuts of beef with fat trimmed off. Low-sodium, lean deli meat.  Dairy  Low-fat (1%) or fat-free (skim) milk. Fat-free, low-fat, or reduced-fat cheeses. Nonfat, low-sodium ricotta or cottage cheese. Low-fat or nonfat yogurt. Low-fat, low-sodium cheese.  Fats and oils  Soft margarine without trans fats. Vegetable oil. Low-fat, reduced-fat, or light mayonnaise and salad dressings (reduced-sodium). Canola, safflower, olive, soybean, and sunflower oils. Avocado.  Seasoning and other foods  Herbs. Spices. Seasoning mixes without salt. Unsalted popcorn and pretzels. Fat-free sweets.  What foods are not recommended?  The items listed may not be a complete list. Talk with your dietitian about what dietary choices are best for you.  Grains  Baked goods made with fat, such as croissants, muffins, or some breads. Dry pasta or rice meal packs.  Vegetables  Creamed or fried vegetables. Vegetables in a cheese sauce. Regular canned vegetables (not low-sodium or reduced-sodium). Regular canned tomato sauce and paste (not low-sodium or reduced-sodium). Regular tomato and vegetable juice (not low-sodium or reduced-sodium). Pickles. Olives.  Fruits  Canned fruit in a light or heavy syrup. Fried fruit. Fruit in cream or butter sauce.  Meat and other protein foods  Fatty cuts of meat. Ribs. Fried meat. Bacon. Sausage. Bologna and other processed lunch meats. Salami. Fatback. Hotdogs. Bratwurst. Salted nuts and seeds. Canned beans with added salt. Canned or smoked fish. Whole eggs or egg yolks. Chicken or turkey with skin.  Dairy  Whole or 2% milk, cream, and half-and-half. Whole or full-fat cream cheese. Whole-fat or sweetened yogurt. Full-fat cheese. Nondairy creamers. Whipped toppings.  Processed cheese and cheese spreads.  Fats and oils  Butter. Stick margarine. Lard. Shortening. Ghee. Bacon fat. Tropical oils, such as coconut, palm kernel, or palm oil.  Seasoning and other foods  Salted popcorn and pretzels. Onion salt, garlic salt, seasoned salt, table salt, and sea salt. Worcestershire sauce. Tartar sauce. Barbecue sauce. Teriyaki sauce. Soy sauce, including reduced-sodium. Steak sauce. Canned and packaged gravies. Fish sauce. Oyster sauce. Cocktail sauce. Horseradish that you find on the shelf. Ketchup. Mustard. Meat flavorings and tenderizers. Bouillon cubes. Hot sauce and Tabasco sauce. Premade or packaged marinades. Premade or packaged taco seasonings. Relishes. Regular salad dressings.  Where to find more information:   National Heart, Lung, and Blood Institute: www.nhlbi.nih.gov   American Heart Association: www.heart.org  Summary   The DASH eating plan is a healthy eating plan that has been shown to reduce high blood pressure (hypertension). It may also reduce your risk for type 2 diabetes, heart disease, and stroke.   With the   DASH eating plan, you should limit salt (sodium) intake to 2,300 mg a day. If you have hypertension, you may need to reduce your sodium intake to 1,500 mg a day.   When on the DASH eating plan, aim to eat more fresh fruits and vegetables, whole grains, lean proteins, low-fat dairy, and heart-healthy fats.   Work with your health care provider or diet and nutrition specialist (dietitian) to adjust your eating plan to your individual calorie needs.  This information is not intended to replace advice given to you by your health care provider. Make sure you discuss any questions you have with your health care provider.  Document Released: 05/15/2011 Document Revised: 05/19/2016 Document Reviewed: 05/19/2016  Elsevier Interactive Patient Education  2019 [employer]

## 2018-06-23 NOTE — Assessment & Plan Note (Signed)
Chronic, stable.  Continue current medication regimen.  Lipid panel next visit.

## 2018-08-06 DIAGNOSIS — L570 Actinic keratosis: Secondary | ICD-10-CM | POA: Diagnosis not present

## 2018-08-06 DIAGNOSIS — Z1283 Encounter for screening for malignant neoplasm of skin: Secondary | ICD-10-CM | POA: Diagnosis not present

## 2018-08-06 DIAGNOSIS — Z86018 Personal history of other benign neoplasm: Secondary | ICD-10-CM | POA: Diagnosis not present

## 2018-08-06 DIAGNOSIS — L578 Other skin changes due to chronic exposure to nonionizing radiation: Secondary | ICD-10-CM | POA: Diagnosis not present

## 2018-08-21 ENCOUNTER — Other Ambulatory Visit: Payer: Self-pay | Admitting: Unknown Physician Specialty

## 2018-08-23 NOTE — Telephone Encounter (Signed)
Requested medication (s) are due for refill today: no  Requested medication (s) are on the active medication list: yes  Last refill:  03/05/2018 for 90 and 1 refill  Future visit scheduled: yes  Notes to clinic:  Angiotensin receptor blockers failed  Requested Prescriptions  Pending Prescriptions Disp Refills   losartan (COZAAR) 50 MG tablet [Pharmacy Med Name: LOSARTAN  50MG   TAB] 90 tablet 1    Sig: TAKE 1 TABLET BY MOUTH  DAILY     Cardiovascular:  Angiotensin Receptor Blockers Failed - 08/21/2018  9:22 PM      Failed - Cr in normal range and within 180 days    Creatinine, Ser  Date Value Ref Range Status  11/09/2017 0.82 0.57 - 1.00 mg/dL Final         Failed - K in normal range and within 180 days    Potassium  Date Value Ref Range Status  11/09/2017 4.4 3.5 - 5.2 mmol/L Final         Passed - Patient is not pregnant      Passed - Last BP in normal range    BP Readings from Last 1 Encounters:  06/23/18 124/81         Passed - Valid encounter within last 6 months    Recent Outpatient Visits          2 months ago Essential hypertension   [place], Henrine Screws T, NP   9 months ago Annual physical exam   [facility] Kathrine Haddock, NP   1 year ago Essential hypertension   [facility] Kathrine Haddock, NP   1 year ago Need for Tdap vaccination   [facility] Kathrine Haddock, NP   2 years ago Lichen sclerosus of female genitalia   [facility] Kathrine Haddock, NP      Future Appointments            In 3 months Cannady, Barbaraann Faster, NP [employer], [place]

## 2018-09-05 ENCOUNTER — Other Ambulatory Visit: Payer: Self-pay | Admitting: Nurse Practitioner

## 2018-10-17 ENCOUNTER — Other Ambulatory Visit: Payer: Self-pay | Admitting: Unknown Physician Specialty

## 2018-11-30 ENCOUNTER — Encounter: Payer: Self-pay | Admitting: Nurse Practitioner

## 2018-11-30 ENCOUNTER — Other Ambulatory Visit: Payer: Self-pay

## 2018-11-30 ENCOUNTER — Ambulatory Visit (INDEPENDENT_AMBULATORY_CARE_PROVIDER_SITE_OTHER): Payer: 59 | Admitting: Nurse Practitioner

## 2018-11-30 VITALS — BP 125/81 | HR 73 | Temp 97.9°F | Ht 63.0 in | Wt 198.2 lb

## 2018-11-30 DIAGNOSIS — Z0000 Encounter for general adult medical examination without abnormal findings: Secondary | ICD-10-CM | POA: Diagnosis not present

## 2018-11-30 DIAGNOSIS — E785 Hyperlipidemia, unspecified: Secondary | ICD-10-CM | POA: Diagnosis not present

## 2018-11-30 DIAGNOSIS — R7301 Impaired fasting glucose: Secondary | ICD-10-CM | POA: Diagnosis not present

## 2018-11-30 DIAGNOSIS — I10 Essential (primary) hypertension: Secondary | ICD-10-CM | POA: Diagnosis not present

## 2018-11-30 DIAGNOSIS — E669 Obesity, unspecified: Secondary | ICD-10-CM

## 2018-11-30 NOTE — Assessment & Plan Note (Signed)
Chronic, ongoing.  Continue current medication regimen.  Obtain labs today.

## 2018-11-30 NOTE — Progress Notes (Addendum)
BP 125/81   Pulse 73   Temp 97.9 F (36.6 C) (Oral)   Ht 5\' 3"  (1.6 m)   Wt 198 lb 3.2 oz (89.9 kg)   LMP  (LMP Unknown)   SpO2 98%   BMI 35.11 kg/m    Subjective:    Patient ID: Lorraine Melendez, female    DOB: [birth year], 58 y.o.   MRN: [id number]  HPI: Lorraine Melendez is a 58 y.o. female presenting on 11/30/2018 for comprehensive medical examination. Current medical complaints include:none  She currently lives with: husband Menopausal Symptoms: no   HYPERTENSION / HYPERLIPIDEMIA Losartan 50 MG daily and Atorvastatin 20 MG. Satisfied with current treatment? yes Duration of hypertension: chronic BP monitoring frequency: not checking BP range:  BP medication side effects: no Duration of hyperlipidemia: chronic Cholesterol medication side effects: no Cholesterol supplements: none Medication compliance: good compliance Aspirin: no Recent stressors: no Recurrent headaches: no Visual changes: no Palpitations: no Dyspnea: no Chest pain: no Lower extremity edema: no Dizzy/lightheaded: no   IFG: History of elevated glucose on labs.  She agrees with obtaining A1C today.  Denies polyuria, polyphagia, polydipsia.  Depression Screen done today and results listed below:  Depression screen [facility] 2/9 11/30/2018 11/09/2017 11/05/2016 10/03/2015  Decreased Interest 0 0 0 0  Down, Depressed, Hopeless 0 0 0 0  PHQ - 2 Score 0 0 0 0  Altered sleeping 0 - 1 -  Tired, decreased energy 0 - 1 -  Change in appetite 0 - 0 -  Feeling bad or failure about yourself  0 - 0 -  Trouble concentrating 0 - 0 -  Moving slowly or fidgety/restless 0 - 0 -  Suicidal thoughts 0 - 0 -  PHQ-9 Score 0 - 2 -  Difficult doing work/chores Not difficult at all - - -    The patient does not have a history of falls. I did not complete a risk assessment for falls. A plan of care for falls was not documented.   Past Medical History:  Past Medical History:  Diagnosis Date  . Anxiety   .  Hyperlipidemia   . Hypertension     Surgical History:  Past Surgical History:  Procedure Laterality Date  . COLONOSCOPY WITH PROPOFOL N/A 12/23/2016   Procedure: COLONOSCOPY WITH PROPOFOL;  Surgeon: Jonathon Bellows, MD;  Location: [facility] ENDOSCOPY;  Service: Endoscopy;  Laterality: N/A;  . TUBAL LIGATION      Medications:  Current Outpatient Medications on File Prior to Visit  Medication Sig  . atorvastatin (LIPITOR) 20 MG tablet TAKE 1 TABLET BY MOUTH  DAILY AT 6PM  . clobetasol ointment (TEMOVATE) 0.05 % APPLY TOPICALLY TWO TIMES  DAILY  . losartan (COZAAR) 50 MG tablet TAKE 1 TABLET BY MOUTH  DAILY  . mometasone (NASONEX) 50 MCG/ACT nasal spray Place 2 sprays into the nose daily.   No current facility-administered medications on file prior to visit.     Allergies:  No Known Allergies  Social History:  Social History   Socioeconomic History  . Marital status: Married    Spouse name: Not on file  . Number of children: Not on file  . Years of education: Not on file  . Highest education level: Not on file  Occupational History  . Not on file  Social Needs  . Financial resource strain: Not on file  . Food insecurity    Worry: Not on file    Inability: Not on file  . Transportation needs  Medical: Not on file    Non-medical: Not on file  Tobacco Use  . Smoking status: Former Research scientist (life sciences)  . Smokeless tobacco: Never Used  Substance and Sexual Activity  . Alcohol use: Yes    Comment: occasional   . Drug use: No  . Sexual activity: Yes  Lifestyle  . Physical activity    Days per week: Not on file    Minutes per session: Not on file  . Stress: Not on file  Relationships  . Social Herbalist on phone: Not on file    Gets together: Not on file    Attends religious service: Not on file    Active member of club or organization: Not on file    Attends meetings of clubs or organizations: Not on file    Relationship status: Not on file  . Intimate partner violence     Fear of current or ex partner: Not on file    Emotionally abused: Not on file    Physically abused: Not on file    Forced sexual activity: Not on file  Other Topics Concern  . Not on file  Social History Narrative  . Not on file   Social History   Tobacco Use  Smoking Status Former Smoker  Smokeless Tobacco Never Used   Social History   Substance and Sexual Activity  Alcohol Use Yes   Comment: occasional     Family History:  Family History  Problem Relation Age of Onset  . Hypertension Mother   . Cancer Mother        breast and uterine  . Breast cancer Mother 19  . Hyperlipidemia Mother   . Hyperlipidemia Father   . Cancer Father        lung  . Hypertension Brother   . Hyperlipidemia Brother   . Cancer Maternal Grandmother        breast and skin  . Breast cancer Maternal Grandmother 60  . Hyperlipidemia Brother   . Hypertension Brother     Past medical history, surgical history, medications, allergies, family history and social history reviewed with patient today and changes made to appropriate areas of the chart.   Review of Systems - negative All other ROS negative except what is listed above and in the HPI.      Objective:    BP 125/81   Pulse 73   Temp 97.9 F (36.6 C) (Oral)   Ht 5\' 3"  (1.6 m)   Wt 198 lb 3.2 oz (89.9 kg)   LMP  (LMP Unknown)   SpO2 98%   BMI 35.11 kg/m   Wt Readings from Last 3 Encounters:  11/30/18 198 lb 3.2 oz (89.9 kg)  06/23/18 194 lb (88 kg)  06/22/18 192 lb (87.1 kg)    Physical Exam Vitals signs and nursing note reviewed.  Constitutional:      General: She is awake. She is not in acute distress.    Appearance: She is well-developed. She is not ill-appearing.  HENT:     Head: Normocephalic.     Right Ear: Hearing normal.     Left Ear: Hearing normal.     Nose: Nose normal.     Mouth/Throat:     Mouth: Mucous membranes are moist.  Eyes:     General: Lids are normal.        Right eye: No discharge.         Left eye: No discharge.     Extraocular Movements:  Extraocular movements intact.     Conjunctiva/sclera: Conjunctivae normal.     Pupils: Pupils are equal, round, and reactive to light.     Visual Fields: Right eye visual fields normal and left eye visual fields normal.  Neck:     Musculoskeletal: Normal range of motion and neck supple.     Thyroid: No thyromegaly.     Vascular: No carotid bruit or JVD.  Cardiovascular:     Rate and Rhythm: Normal rate and regular rhythm.     Heart sounds: Normal heart sounds. No murmur. No gallop.   Pulmonary:     Effort: Pulmonary effort is normal. No accessory muscle usage or respiratory distress.     Breath sounds: Normal breath sounds.  Chest:     Breasts:        Right: Normal. No swelling, bleeding, inverted nipple, mass, nipple discharge, skin change or tenderness.        Left: Normal. No swelling, bleeding, inverted nipple, mass, nipple discharge, skin change or tenderness.  Abdominal:     General: Bowel sounds are normal.     Palpations: Abdomen is soft. There is no hepatomegaly or splenomegaly.  Musculoskeletal:     Right lower leg: No edema.     Left lower leg: No edema.  Lymphadenopathy:     Cervical: No cervical adenopathy.     Upper Body:     Right upper body: No supraclavicular, axillary or pectoral adenopathy.     Left upper body: No supraclavicular, axillary or pectoral adenopathy.  Skin:    General: Skin is warm and dry.  Neurological:     Mental Status: She is alert and oriented to person, place, and time.     Cranial Nerves: Cranial nerves are intact.     Coordination: Coordination is intact.     Gait: Gait is intact.  Psychiatric:        Attention and Perception: Attention normal.        Mood and Affect: Mood normal.        Speech: Speech normal.        Behavior: Behavior normal. Behavior is cooperative.        Thought Content: Thought content normal.        Cognition and Memory: Cognition normal.        Judgment:  Judgment normal.     Results for orders placed or performed in visit on 11/09/17  CBC with Differential/Platelet  Result Value Ref Range   WBC 7.2 3.4 - 10.8 x10E3/uL   RBC 4.73 3.77 - 5.28 x10E6/uL   Hemoglobin 14.3 11.1 - 15.9 g/dL   Hematocrit 44.6 34.0 - 46.6 %   MCV 94 79 - 97 fL   MCH 30.2 26.6 - 33.0 pg   MCHC 32.1 31.5 - 35.7 g/dL   RDW 13.5 12.3 - 15.4 %   Platelets 278 150 - 450 x10E3/uL   Neutrophils 65 Not Estab. %   Lymphs 25 Not Estab. %   Monocytes 7 Not Estab. %   Eos 3 Not Estab. %   Basos 0 Not Estab. %   Neutrophils Absolute 4.7 1.4 - 7.0 x10E3/uL   Lymphocytes Absolute 1.8 0.7 - 3.1 x10E3/uL   Monocytes Absolute 0.5 0.1 - 0.9 x10E3/uL   EOS (ABSOLUTE) 0.2 0.0 - 0.4 x10E3/uL   Basophils Absolute 0.0 0.0 - 0.2 x10E3/uL   Immature Granulocytes 0 Not Estab. %   Immature Grans (Abs) 0.0 0.0 - 0.1 x10E3/uL  Comprehensive metabolic panel  Result  Value Ref Range   Glucose 87 65 - 99 mg/dL   BUN 14 6 - 24 mg/dL   Creatinine, Ser 0.82 0.57 - 1.00 mg/dL   GFR calc non Af Amer 79 >59 mL/min/1.73   GFR calc Af Amer 91 >59 mL/min/1.73   BUN/Creatinine Ratio 17 9 - 23   Sodium 142 134 - 144 mmol/L   Potassium 4.4 3.5 - 5.2 mmol/L   Chloride 103 96 - 106 mmol/L   CO2 23 20 - 29 mmol/L   Calcium 9.5 8.7 - 10.2 mg/dL   Total Protein 7.2 6.0 - 8.5 g/dL   Albumin 4.6 3.5 - 5.5 g/dL   Globulin, Total 2.6 1.5 - 4.5 g/dL   Albumin/Globulin Ratio 1.8 1.2 - 2.2   Bilirubin Total 0.4 0.0 - 1.2 mg/dL   Alkaline Phosphatase 85 39 - 117 IU/L   AST 25 0 - 40 IU/L   ALT 37 (H) 0 - 32 IU/L  Lipid Panel w/o Chol/HDL Ratio  Result Value Ref Range   Cholesterol, Total 159 100 - 199 mg/dL   Triglycerides 161 (H) 0 - 149 mg/dL   HDL 46 >39 mg/dL   VLDL Cholesterol Cal 32 5 - 40 mg/dL   LDL Calculated 81 0 - 99 mg/dL  TSH  Result Value Ref Range   TSH 2.720 0.450 - 4.500 uIU/mL  VITAMIN D 25 Hydroxy (Vit-D Deficiency, Fractures)  Result Value Ref Range   Vit D, 25-Hydroxy  30.6 30.0 - 100.0 ng/mL  IGP, Aptima HPV, rfx 16/18,45  Result Value Ref Range   DIAGNOSIS: Comment    Specimen adequacy: Comment    Clinician Provided ICD10 Comment    Performed by: Comment    PAP Smear Comment .    Note: Comment    Test Methodology Comment    HPV Aptima Negative Negative      Assessment & Plan:   Problem List Items Addressed This Visit      Cardiovascular and Mediastinum   Hypertension    Chronic, stable.  BP below goal.  Continue current medication regimen.  Labs today.      Relevant Orders   CBC with Differential/Platelet   Comprehensive metabolic panel     Endocrine   Impaired fasting glucose    A1C today.      Relevant Orders   HgB A1c     Other   Hyperlipidemia    Chronic, ongoing.  Continue current medication regimen.  Obtain labs today.      Relevant Orders   Comprehensive metabolic panel   Lipid Panel w/o Chol/HDL Ratio   Obesity (BMI 30-39.9)    Recommend continued focus on health diet choices and regular physical activity (30 minutes 5 days a week).       Other Visit Diagnoses    Encounter for annual physical exam    -  Primary   Relevant Orders   TSH   VITAMIN D 25 Hydroxy (Vit-D Deficiency, Fractures)       Follow up plan: Return in about 6 months (around 06/01/2019) for HTN/HLD, IFG.   LABORATORY TESTING:  - Pap smear: up to date  IMMUNIZATIONS:   - Tdap: Tetanus vaccination status reviewed: last tetanus booster within 10 years. - Influenza: Up to date - Pneumovax: Not applicable - Prevnar: Not applicable - HPV: Not applicable - Zostavax vaccine: Refused  SCREENING: -Mammogram: Up to date  - Colonoscopy: Up to date  - Bone Density: Not applicable  -Hearing Test: Not applicable  -Spirometry:  Not applicable   PATIENT COUNSELING:   Advised to take 1 mg of folate supplement per day if capable of pregnancy.   Sexuality: Discussed sexually transmitted diseases, partner selection, use of condoms, avoidance of  unintended pregnancy  and contraceptive alternatives.   Advised to avoid cigarette smoking.  I discussed with the patient that most people either abstain from alcohol or drink within safe limits (<=14/week and <=4 drinks/occasion for males, <=7/weeks and <= 3 drinks/occasion for females) and that the risk for alcohol disorders and other health effects rises proportionally with the number of drinks per week and how often a drinker exceeds daily limits.  Discussed cessation/primary prevention of drug use and availability of treatment for abuse.   Diet: Encouraged to adjust caloric intake to maintain  or achieve ideal body weight, to reduce intake of dietary saturated fat and total fat, to limit sodium intake by avoiding high sodium foods and not adding table salt, and to maintain adequate dietary potassium and calcium preferably from fresh fruits, vegetables, and low-fat dairy products.    stressed the importance of regular exercise  Injury prevention: Discussed safety belts, safety helmets, smoke detector, smoking near bedding or upholstery.   Dental health: Discussed importance of regular tooth brushing, flossing, and dental visits.    NEXT PREVENTATIVE PHYSICAL DUE IN 1 YEAR. Return in about 6 months (around 06/01/2019) for HTN/HLD, IFG.

## 2018-11-30 NOTE — Assessment & Plan Note (Signed)
Chronic, stable.  BP below goal.  Continue current medication regimen.  Labs today.

## 2018-11-30 NOTE — Assessment & Plan Note (Signed)
Recommend continued focus on health diet choices and regular physical activity (30 minutes 5 days a week). 

## 2018-11-30 NOTE — Patient Instructions (Signed)
DASH Eating Plan  DASH stands for "Dietary Approaches to Stop Hypertension." The DASH eating plan is a healthy eating plan that has been shown to reduce high blood pressure (hypertension). It may also reduce your risk for type 2 diabetes, heart disease, and stroke. The DASH eating plan may also help with weight loss.  What are tips for following this plan?    General guidelines   Avoid eating more than 2,300 mg (milligrams) of salt (sodium) a day. If you have hypertension, you may need to reduce your sodium intake to 1,500 mg a day.   Limit alcohol intake to no more than 1 drink a day for nonpregnant women and 2 drinks a day for men. One drink equals 12 oz of beer, 5 oz of wine, or 1 oz of hard liquor.   Work with your health care provider to maintain a healthy body weight or to lose weight. Ask what an ideal weight is for you.   Get at least 30 minutes of exercise that causes your heart to beat faster (aerobic exercise) most days of the week. Activities may include walking, swimming, or biking.   Work with your health care provider or diet and nutrition specialist (dietitian) to adjust your eating plan to your individual calorie needs.  Reading food labels     Check food labels for the amount of sodium per serving. Choose foods with less than 5 percent of the Daily Value of sodium. Generally, foods with less than 300 mg of sodium per serving fit into this eating plan.   To find whole grains, look for the word "whole" as the first word in the ingredient list.  Shopping   Buy products labeled as "low-sodium" or "no salt added."   Buy fresh foods. Avoid canned foods and premade or frozen meals.  Cooking   Avoid adding salt when cooking. Use salt-free seasonings or herbs instead of table salt or sea salt. Check with your health care provider or pharmacist before using salt substitutes.   Do not fry foods. Cook foods using healthy methods such as baking, boiling, grilling, and broiling instead.   Cook with  heart-healthy oils, such as olive, canola, soybean, or sunflower oil.  Meal planning   Eat a balanced diet that includes:  ? 5 or more servings of fruits and vegetables each day. At each meal, try to fill half of your plate with fruits and vegetables.  ? Up to 6-8 servings of whole grains each day.  ? Less than 6 oz of lean meat, poultry, or fish each day. A 3-oz serving of meat is about the same size as a deck of cards. One egg equals 1 oz.  ? 2 servings of low-fat dairy each day.  ? A serving of nuts, seeds, or beans 5 times each week.  ? Heart-healthy fats. Healthy fats called Omega-3 fatty acids are found in foods such as flaxseeds and coldwater fish, like sardines, salmon, and mackerel.   Limit how much you eat of the following:  ? Canned or prepackaged foods.  ? Food that is high in trans fat, such as fried foods.  ? Food that is high in saturated fat, such as fatty meat.  ? Sweets, desserts, sugary drinks, and other foods with added sugar.  ? Full-fat dairy products.   Do not salt foods before eating.   Try to eat at least 2 vegetarian meals each week.   Eat more home-cooked food and less restaurant, buffet, and fast food.     When eating at a restaurant, ask that your food be prepared with less salt or no salt, if possible.  What foods are recommended?  The items listed may not be a complete list. Talk with your dietitian about what dietary choices are best for you.  Grains  Whole-grain or whole-wheat bread. Whole-grain or whole-wheat pasta. Brown rice. Oatmeal. Quinoa. Bulgur. Whole-grain and low-sodium cereals. Pita bread. Low-fat, low-sodium crackers. Whole-wheat flour tortillas.  Vegetables  Fresh or frozen vegetables (raw, steamed, roasted, or grilled). Low-sodium or reduced-sodium tomato and vegetable juice. Low-sodium or reduced-sodium tomato sauce and tomato paste. Low-sodium or reduced-sodium canned vegetables.  Fruits  All fresh, dried, or frozen fruit. Canned fruit in natural juice (without  added sugar).  Meat and other protein foods  Skinless chicken or turkey. Ground chicken or turkey. Pork with fat trimmed off. Fish and seafood. Egg whites. Dried beans, peas, or lentils. Unsalted nuts, nut butters, and seeds. Unsalted canned beans. Lean cuts of beef with fat trimmed off. Low-sodium, lean deli meat.  Dairy  Low-fat (1%) or fat-free (skim) milk. Fat-free, low-fat, or reduced-fat cheeses. Nonfat, low-sodium ricotta or cottage cheese. Low-fat or nonfat yogurt. Low-fat, low-sodium cheese.  Fats and oils  Soft margarine without trans fats. Vegetable oil. Low-fat, reduced-fat, or light mayonnaise and salad dressings (reduced-sodium). Canola, safflower, olive, soybean, and sunflower oils. Avocado.  Seasoning and other foods  Herbs. Spices. Seasoning mixes without salt. Unsalted popcorn and pretzels. Fat-free sweets.  What foods are not recommended?  The items listed may not be a complete list. Talk with your dietitian about what dietary choices are best for you.  Grains  Baked goods made with fat, such as croissants, muffins, or some breads. Dry pasta or rice meal packs.  Vegetables  Creamed or fried vegetables. Vegetables in a cheese sauce. Regular canned vegetables (not low-sodium or reduced-sodium). Regular canned tomato sauce and paste (not low-sodium or reduced-sodium). Regular tomato and vegetable juice (not low-sodium or reduced-sodium). Pickles. Olives.  Fruits  Canned fruit in a light or heavy syrup. Fried fruit. Fruit in cream or butter sauce.  Meat and other protein foods  Fatty cuts of meat. Ribs. Fried meat. Bacon. Sausage. Bologna and other processed lunch meats. Salami. Fatback. Hotdogs. Bratwurst. Salted nuts and seeds. Canned beans with added salt. Canned or smoked fish. Whole eggs or egg yolks. Chicken or turkey with skin.  Dairy  Whole or 2% milk, cream, and half-and-half. Whole or full-fat cream cheese. Whole-fat or sweetened yogurt. Full-fat cheese. Nondairy creamers. Whipped toppings.  Processed cheese and cheese spreads.  Fats and oils  Butter. Stick margarine. Lard. Shortening. Ghee. Bacon fat. Tropical oils, such as coconut, palm kernel, or palm oil.  Seasoning and other foods  Salted popcorn and pretzels. Onion salt, garlic salt, seasoned salt, table salt, and sea salt. Worcestershire sauce. Tartar sauce. Barbecue sauce. Teriyaki sauce. Soy sauce, including reduced-sodium. Steak sauce. Canned and packaged gravies. Fish sauce. Oyster sauce. Cocktail sauce. Horseradish that you find on the shelf. Ketchup. Mustard. Meat flavorings and tenderizers. Bouillon cubes. Hot sauce and Tabasco sauce. Premade or packaged marinades. Premade or packaged taco seasonings. Relishes. Regular salad dressings.  Where to find more information:   National Heart, Lung, and Blood Institute: www.nhlbi.nih.gov   American Heart Association: www.heart.org  Summary   The DASH eating plan is a healthy eating plan that has been shown to reduce high blood pressure (hypertension). It may also reduce your risk for type 2 diabetes, heart disease, and stroke.   With the   DASH eating plan, you should limit salt (sodium) intake to 2,300 mg a day. If you have hypertension, you may need to reduce your sodium intake to 1,500 mg a day.   When on the DASH eating plan, aim to eat more fresh fruits and vegetables, whole grains, lean proteins, low-fat dairy, and heart-healthy fats.   Work with your health care provider or diet and nutrition specialist (dietitian) to adjust your eating plan to your individual calorie needs.  This information is not intended to replace advice given to you by your health care provider. Make sure you discuss any questions you have with your health care provider.  Document Released: 05/15/2011 Document Revised: 05/19/2016 Document Reviewed: 05/19/2016  Elsevier Interactive Patient Education  2019 [employer]

## 2018-11-30 NOTE — Assessment & Plan Note (Signed)
A1C today. 

## 2018-12-01 LAB — COMPREHENSIVE METABOLIC PANEL
ALT: 25 IU/L (ref 0–32)
AST: 20 IU/L (ref 0–40)
Albumin/Globulin Ratio: 1.9 (ref 1.2–2.2)
Albumin: 4.3 g/dL (ref 3.8–4.9)
Alkaline Phosphatase: 73 IU/L (ref 39–117)
BUN/Creatinine Ratio: 19 (ref 9–23)
BUN: 14 mg/dL (ref 6–24)
Bilirubin Total: 0.5 mg/dL (ref 0.0–1.2)
CO2: 24 mmol/L (ref 20–29)
Calcium: 9.3 mg/dL (ref 8.7–10.2)
Chloride: 104 mmol/L (ref 96–106)
Creatinine, Ser: 0.73 mg/dL (ref 0.57–1.00)
GFR calc Af Amer: 104 mL/min/{1.73_m2} (ref >59)
GFR calc non Af Amer: 90 mL/min/{1.73_m2} (ref >59)
Globulin, Total: 2.3 g/dL (ref 1.5–4.5)
Glucose: 84 mg/dL (ref 65–99)
Potassium: 4.4 mmol/L (ref 3.5–5.2)
Sodium: 141 mmol/L (ref 134–144)
Total Protein: 6.6 g/dL (ref 6.0–8.5)

## 2018-12-01 LAB — LIPID PANEL W/O CHOL/HDL RATIO
Cholesterol, Total: 145 mg/dL (ref 100–199)
HDL: 43 mg/dL (ref >39)
LDL Calculated: 82 mg/dL (ref 0–99)
Triglycerides: 99 mg/dL (ref 0–149)
VLDL Cholesterol Cal: 20 mg/dL (ref 5–40)

## 2018-12-01 LAB — CBC WITH DIFFERENTIAL/PLATELET
Basophils Absolute: 0.1 10*3/uL (ref 0.0–0.2)
Basos: 1 %
EOS (ABSOLUTE): 0.2 10*3/uL (ref 0.0–0.4)
Eos: 3 %
Hematocrit: 42 % (ref 34.0–46.6)
Hemoglobin: 14.2 g/dL (ref 11.1–15.9)
Immature Grans (Abs): 0 10*3/uL (ref 0.0–0.1)
Immature Granulocytes: 0 %
Lymphocytes Absolute: 1.7 10*3/uL (ref 0.7–3.1)
Lymphs: 25 %
MCH: 31.2 pg (ref 26.6–33.0)
MCHC: 33.8 g/dL (ref 31.5–35.7)
MCV: 92 fL (ref 79–97)
Monocytes Absolute: 0.6 10*3/uL (ref 0.1–0.9)
Monocytes: 9 %
Neutrophils Absolute: 4.3 10*3/uL (ref 1.4–7.0)
Neutrophils: 62 %
Platelets: 237 10*3/uL (ref 150–450)
RBC: 4.55 x10E6/uL (ref 3.77–5.28)
RDW: 13 % (ref 11.7–15.4)
WBC: 6.8 10*3/uL (ref 3.4–10.8)

## 2018-12-01 LAB — VITAMIN D 25 HYDROXY (VIT D DEFICIENCY, FRACTURES): Vit D, 25-Hydroxy: 46.7 ng/mL (ref 30.0–100.0)

## 2018-12-01 LAB — TSH: TSH: 2.97 u[IU]/mL (ref 0.450–4.500)

## 2018-12-01 LAB — HEMOGLOBIN A1C
Est. average glucose Bld gHb Est-mCnc: 105 mg/dL
Hgb A1c MFr Bld: 5.3 % (ref 4.8–5.6)

## 2019-01-10 ENCOUNTER — Other Ambulatory Visit: Payer: Self-pay | Admitting: Unknown Physician Specialty

## 2019-02-07 ENCOUNTER — Other Ambulatory Visit: Payer: Self-pay | Admitting: Nurse Practitioner

## 2019-03-07 ENCOUNTER — Other Ambulatory Visit: Payer: Self-pay

## 2019-03-07 ENCOUNTER — Encounter: Payer: Self-pay | Admitting: Family Medicine

## 2019-03-07 ENCOUNTER — Ambulatory Visit (INDEPENDENT_AMBULATORY_CARE_PROVIDER_SITE_OTHER): Payer: 59 | Admitting: Family Medicine

## 2019-03-07 DIAGNOSIS — I10 Essential (primary) hypertension: Secondary | ICD-10-CM | POA: Diagnosis not present

## 2019-03-07 DIAGNOSIS — J069 Acute upper respiratory infection, unspecified: Secondary | ICD-10-CM | POA: Insufficient documentation

## 2019-03-07 DIAGNOSIS — Z20822 Contact with and (suspected) exposure to covid-19: Secondary | ICD-10-CM

## 2019-03-07 MED ORDER — BENZONATATE 100 MG PO CAPS: 100.0000 mg | ORAL_CAPSULE | Freq: Two times a day (BID) | ORAL | 1 refills | Status: DC | PRN

## 2019-03-07 MED ORDER — AZITHROMYCIN 250 MG PO TABS: ORAL_TABLET | ORAL | 0 refills | Status: DC

## 2019-03-07 NOTE — Progress Notes (Addendum)
LMP  (LMP Unknown)    Subjective:    Patient ID: Lorraine Melendez, female    DOB: [birth year], 58 y.o.   MRN: [id number]  HPI: Lorraine Melendez is a 58 y.o. female  URI sx  Patient for the last 4 days is having URI symptoms with head cold drainage facial pressure feeling bad and cough no fever but generalized systemic symptoms of diaphoresis and fatigue.  Patient's had a dry nonproductive cough is tried some OTCs with little help and much drowsiness.  Relevant past medical, surgical, family and social history reviewed and updated as indicated. Interim medical history since our last visit reviewed. Allergies and medications reviewed and updated.  Review of Systems  Constitutional: Positive for diaphoresis and fatigue. Negative for chills and fever.  Respiratory: Positive for cough.   Cardiovascular: Negative.     Per HPI unless specifically indicated above     Objective:    LMP  (LMP Unknown)   Wt Readings from Last 3 Encounters:  11/30/18 198 lb 3.2 oz (89.9 kg)  06/23/18 194 lb (88 kg)  06/22/18 192 lb (87.1 kg)    Physical Exam  Results for orders placed or performed in visit on 11/30/18  CBC with Differential/Platelet  Result Value Ref Range   WBC 6.8 3.4 - 10.8 x10E3/uL   RBC 4.55 3.77 - 5.28 x10E6/uL   Hemoglobin 14.2 11.1 - 15.9 g/dL   Hematocrit 42.0 34.0 - 46.6 %   MCV 92 79 - 97 fL   MCH 31.2 26.6 - 33.0 pg   MCHC 33.8 31.5 - 35.7 g/dL   RDW 13.0 11.7 - 15.4 %   Platelets 237 150 - 450 x10E3/uL   Neutrophils 62 Not Estab. %   Lymphs 25 Not Estab. %   Monocytes 9 Not Estab. %   Eos 3 Not Estab. %   Basos 1 Not Estab. %   Neutrophils Absolute 4.3 1.4 - 7.0 x10E3/uL   Lymphocytes Absolute 1.7 0.7 - 3.1 x10E3/uL   Monocytes Absolute 0.6 0.1 - 0.9 x10E3/uL   EOS (ABSOLUTE) 0.2 0.0 - 0.4 x10E3/uL   Basophils Absolute 0.1 0.0 - 0.2 x10E3/uL   Immature Granulocytes 0 Not Estab. %   Immature Grans (Abs) 0.0 0.0 - 0.1 x10E3/uL  Comprehensive metabolic  panel  Result Value Ref Range   Glucose 84 65 - 99 mg/dL   BUN 14 6 - 24 mg/dL   Creatinine, Ser 0.73 0.57 - 1.00 mg/dL   GFR calc non Af Amer 90 >59 mL/min/1.73   GFR calc Af Amer 104 >59 mL/min/1.73   BUN/Creatinine Ratio 19 9 - 23   Sodium 141 134 - 144 mmol/L   Potassium 4.4 3.5 - 5.2 mmol/L   Chloride 104 96 - 106 mmol/L   CO2 24 20 - 29 mmol/L   Calcium 9.3 8.7 - 10.2 mg/dL   Total Protein 6.6 6.0 - 8.5 g/dL   Albumin 4.3 3.8 - 4.9 g/dL   Globulin, Total 2.3 1.5 - 4.5 g/dL   Albumin/Globulin Ratio 1.9 1.2 - 2.2   Bilirubin Total 0.5 0.0 - 1.2 mg/dL   Alkaline Phosphatase 73 39 - 117 IU/L   AST 20 0 - 40 IU/L   ALT 25 0 - 32 IU/L  Lipid Panel w/o Chol/HDL Ratio  Result Value Ref Range   Cholesterol, Total 145 100 - 199 mg/dL   Triglycerides 99 0 - 149 mg/dL   HDL 43 >39 mg/dL   VLDL Cholesterol Cal 20 5 -  40 mg/dL   LDL Calculated 82 0 - 99 mg/dL  TSH  Result Value Ref Range   TSH 2.970 0.450 - 4.500 uIU/mL  VITAMIN D 25 Hydroxy (Vit-D Deficiency, Fractures)  Result Value Ref Range   Vit D, 25-Hydroxy 46.7 30.0 - 100.0 ng/mL  HgB A1c  Result Value Ref Range   Hgb A1c MFr Bld 5.3 4.8 - 5.6 %   Est. average glucose Bld gHb Est-mCnc 105 mg/dL      Assessment & Plan:   Problem List Items Addressed This Visit      Cardiovascular and Mediastinum   Hypertension    The current medical regimen is effective;  continue present plan and medications.         Respiratory   URI (upper respiratory infection)    Discussed URI sinusitis will start because getting worse Z-Pak and Tessalon Perles will also do COVID-19 testing discussed isolation procedures.      Relevant Medications   azithromycin (ZITHROMAX) 250 MG tablet      Telemedicine using audio/video telecommunications for a synchronous communication visit. Today's visit due to COVID-19 isolation precautions I connected with and verified that I am speaking with the correct person using two identifiers.   I  discussed the limitations, risks, security and privacy concerns of performing an evaluation and management service by telecommunication and the availability of in person appointments. I also discussed with the patient that there may be a patient responsible charge related to this service. The patient expressed understanding and agreed to proceed. The patient's location is home. I am at home.   I discussed the assessment and treatment plan with the patient. The patient was provided an opportunity to ask questions and all were answered. The patient agreed with the plan and demonstrated an understanding of the instructions.   The patient was advised to call back or seek an in-person evaluation if the symptoms worsen or if the condition fails to improve as anticipated.   I provided 21+ minutes of time during this encounter. Follow up plan: Return if symptoms worsen or fail to improve.

## 2019-03-07 NOTE — Assessment & Plan Note (Signed)
The current medical regimen is effective;  continue present plan and medications.  

## 2019-03-07 NOTE — Assessment & Plan Note (Signed)
Discussed URI sinusitis will start because getting worse Z-Pak and Tessalon Perles will also do COVID-19 testing discussed isolation procedures.

## 2019-03-08 LAB — NOVEL CORONAVIRUS, NAA: SARS-CoV-2, NAA: NOT DETECTED

## 2019-03-08 LAB — SPECIMEN STATUS REPORT

## 2019-06-01 ENCOUNTER — Ambulatory Visit: Payer: 59 | Admitting: Nurse Practitioner

## 2019-06-14 ENCOUNTER — Ambulatory Visit (INDEPENDENT_AMBULATORY_CARE_PROVIDER_SITE_OTHER): Payer: 59 | Admitting: Nurse Practitioner

## 2019-06-14 ENCOUNTER — Other Ambulatory Visit: Payer: Self-pay

## 2019-06-14 ENCOUNTER — Encounter: Payer: Self-pay | Admitting: Nurse Practitioner

## 2019-06-14 VITALS — BP 133/77 | HR 102

## 2019-06-14 DIAGNOSIS — F4321 Adjustment disorder with depressed mood: Secondary | ICD-10-CM | POA: Insufficient documentation

## 2019-06-14 DIAGNOSIS — E782 Mixed hyperlipidemia: Secondary | ICD-10-CM

## 2019-06-14 DIAGNOSIS — I10 Essential (primary) hypertension: Secondary | ICD-10-CM

## 2019-06-14 MED ORDER — BUSPIRONE HCL 5 MG PO TABS: 5.0000 mg | ORAL_TABLET | Freq: Two times a day (BID) | ORAL | 3 refills | Status: DC

## 2019-06-14 NOTE — Assessment & Plan Note (Signed)
Acute after loss father.  Discussed risks/benefits of benzo use with anxiety.  Will trial Buspar, script sent.  Referral for talk therapy placed. Denies SI/HI.  Return in 4 weeks.

## 2019-06-14 NOTE — Assessment & Plan Note (Signed)
Chronic, stable.  BP below goal on recent readings.  Continue current medication regimen and adjust as needed.  Labs outpatient.  Recommend she check BP at home a few days a week.

## 2019-06-14 NOTE — Patient Instructions (Signed)
DASH Eating Plan DASH stands for "Dietary Approaches to Stop Hypertension." The DASH eating plan is a healthy eating plan that has been shown to reduce high blood pressure (hypertension). It may also reduce your risk for type 2 diabetes, heart disease, and stroke. The DASH eating plan may also help with weight loss. What are tips for following this plan?  General guidelines  Avoid eating more than 2,300 mg (milligrams) of salt (sodium) a day. If you have hypertension, you may need to reduce your sodium intake to 1,500 mg a day.  Limit alcohol intake to no more than 1 drink a day for nonpregnant women and 2 drinks a day for men. One drink equals 12 oz of beer, 5 oz of wine, or 1 oz of hard liquor.  Work with your health care provider to maintain a healthy body weight or to lose weight. Ask what an ideal weight is for you.  Get at least 30 minutes of exercise that causes your heart to beat faster (aerobic exercise) most days of the week. Activities may include walking, swimming, or biking.  Work with your health care provider or diet and nutrition specialist (dietitian) to adjust your eating plan to your individual calorie needs. Reading food labels   Check food labels for the amount of sodium per serving. Choose foods with less than 5 percent of the Daily Value of sodium. Generally, foods with less than 300 mg of sodium per serving fit into this eating plan.  To find whole grains, look for the word "whole" as the first word in the ingredient list. Shopping  Buy products labeled as "low-sodium" or "no salt added."  Buy fresh foods. Avoid canned foods and premade or frozen meals. Cooking  Avoid adding salt when cooking. Use salt-free seasonings or herbs instead of table salt or sea salt. Check with your health care provider or pharmacist before using salt substitutes.  Do not fry foods. Cook foods using healthy methods such as baking, boiling, grilling, and broiling instead.  Cook with  heart-healthy oils, such as olive, canola, soybean, or sunflower oil. Meal planning  Eat a balanced diet that includes: ? 5 or more servings of fruits and vegetables each day. At each meal, try to fill half of your plate with fruits and vegetables. ? Up to 6-8 servings of whole grains each day. ? Less than 6 oz of lean meat, poultry, or fish each day. A 3-oz serving of meat is about the same size as a deck of cards. One egg equals 1 oz. ? 2 servings of low-fat dairy each day. ? A serving of nuts, seeds, or beans 5 times each week. ? Heart-healthy fats. Healthy fats called Omega-3 fatty acids are found in foods such as flaxseeds and coldwater fish, like sardines, salmon, and mackerel.  Limit how much you eat of the following: ? Canned or prepackaged foods. ? Food that is high in trans fat, such as fried foods. ? Food that is high in saturated fat, such as fatty meat. ? Sweets, desserts, sugary drinks, and other foods with added sugar. ? Full-fat dairy products.  Do not salt foods before eating.  Try to eat at least 2 vegetarian meals each week.  Eat more home-cooked food and less restaurant, buffet, and fast food.  When eating at a restaurant, ask that your food be prepared with less salt or no salt, if possible. What foods are recommended? The items listed may not be a complete list. Talk with your dietitian about   what dietary choices are best for you. Grains Whole-grain or whole-wheat bread. Whole-grain or whole-wheat pasta. Brown rice. Oatmeal. Quinoa. Bulgur. Whole-grain and low-sodium cereals. Pita bread. Low-fat, low-sodium crackers. Whole-wheat flour tortillas. Vegetables Fresh or frozen vegetables (raw, steamed, roasted, or grilled). Low-sodium or reduced-sodium tomato and vegetable juice. Low-sodium or reduced-sodium tomato sauce and tomato paste. Low-sodium or reduced-sodium canned vegetables. Fruits All fresh, dried, or frozen fruit. Canned fruit in natural juice (without  added sugar). Meat and other protein foods Skinless chicken or turkey. Ground chicken or turkey. Pork with fat trimmed off. Fish and seafood. Egg whites. Dried beans, peas, or lentils. Unsalted nuts, nut butters, and seeds. Unsalted canned beans. Lean cuts of beef with fat trimmed off. Low-sodium, lean deli meat. Dairy Low-fat (1%) or fat-free (skim) milk. Fat-free, low-fat, or reduced-fat cheeses. Nonfat, low-sodium ricotta or cottage cheese. Low-fat or nonfat yogurt. Low-fat, low-sodium cheese. Fats and oils Soft margarine without trans fats. Vegetable oil. Low-fat, reduced-fat, or light mayonnaise and salad dressings (reduced-sodium). Canola, safflower, olive, soybean, and sunflower oils. Avocado. Seasoning and other foods Herbs. Spices. Seasoning mixes without salt. Unsalted popcorn and pretzels. Fat-free sweets. What foods are not recommended? The items listed may not be a complete list. Talk with your dietitian about what dietary choices are best for you. Grains Baked goods made with fat, such as croissants, muffins, or some breads. Dry pasta or rice meal packs. Vegetables Creamed or fried vegetables. Vegetables in a cheese sauce. Regular canned vegetables (not low-sodium or reduced-sodium). Regular canned tomato sauce and paste (not low-sodium or reduced-sodium). Regular tomato and vegetable juice (not low-sodium or reduced-sodium). Pickles. Olives. Fruits Canned fruit in a light or heavy syrup. Fried fruit. Fruit in cream or butter sauce. Meat and other protein foods Fatty cuts of meat. Ribs. Fried meat. Bacon. Sausage. Bologna and other processed lunch meats. Salami. Fatback. Hotdogs. Bratwurst. Salted nuts and seeds. Canned beans with added salt. Canned or smoked fish. Whole eggs or egg yolks. Chicken or turkey with skin. Dairy Whole or 2% milk, cream, and half-and-half. Whole or full-fat cream cheese. Whole-fat or sweetened yogurt. Full-fat cheese. Nondairy creamers. Whipped toppings.  Processed cheese and cheese spreads. Fats and oils Butter. Stick margarine. Lard. Shortening. Ghee. Bacon fat. Tropical oils, such as coconut, palm kernel, or palm oil. Seasoning and other foods Salted popcorn and pretzels. Onion salt, garlic salt, seasoned salt, table salt, and sea salt. Worcestershire sauce. Tartar sauce. Barbecue sauce. Teriyaki sauce. Soy sauce, including reduced-sodium. Steak sauce. Canned and packaged gravies. Fish sauce. Oyster sauce. Cocktail sauce. Horseradish that you find on the shelf. Ketchup. Mustard. Meat flavorings and tenderizers. Bouillon cubes. Hot sauce and Tabasco sauce. Premade or packaged marinades. Premade or packaged taco seasonings. Relishes. Regular salad dressings. Where to find more information:  National Heart, Lung, and Blood Institute: www.nhlbi.nih.gov  American Heart Association: www.heart.org Summary  The DASH eating plan is a healthy eating plan that has been shown to reduce high blood pressure (hypertension). It may also reduce your risk for type 2 diabetes, heart disease, and stroke.  With the DASH eating plan, you should limit salt (sodium) intake to 2,300 mg a day. If you have hypertension, you may need to reduce your sodium intake to 1,500 mg a day.  When on the DASH eating plan, aim to eat more fresh fruits and vegetables, whole grains, lean proteins, low-fat dairy, and heart-healthy fats.  Work with your health care provider or diet and nutrition specialist (dietitian) to adjust your eating plan to your   individual calorie needs. This information is not intended to replace advice given to you by your health care provider. Make sure you discuss any questions you have with your health care provider. Document Revised: 05/08/2017 Document Reviewed: 05/19/2016 Elsevier Patient Education  2020 Elsevier Inc.  

## 2019-06-14 NOTE — Assessment & Plan Note (Signed)
Chronic, ongoing.  Continue current medication regimen and adjust as needed.  Labs outpatient.

## 2019-06-14 NOTE — Progress Notes (Signed)
LMP  (LMP Unknown)    Subjective:    Patient ID: Lorraine Melendez, female    DOB: [birth year], 60 y.o.   MRN: [id number]  HPI: Lorraine Melendez is a 59 y.o. female  Chief Complaint  Patient presents with  . Hyperlipidemia  . Hypertension    . This visit was completed via FaceTime due to the restrictions of the COVID-19 pandemic. All issues as above were discussed and addressed. Physical exam was done as above through visual confirmation on FaceTime. If it was felt that the patient should be evaluated in the office, they were directed there. The patient verbally consented to this visit. . Location of the patient: home . Location of the provider: work . Those involved with this call:  . Provider: Marnee Guarneri, DNP . CMA: Yvonna Alanis, CMA . Front Desk/Registration: Don Perking  . Time spent on call: 15 minutes with patient face to face via video conference. More than 50% of this time was spent in counseling and coordination of care. 10 minutes total spent in review of patient's record and preparation of their chart.  . I verified patient identity using two factors (patient name and date of birth). Patient consents verbally to being seen via telemedicine visit today.    HYPERTENSION / HYPERLIPIDEMIA Currently Losartan 50 MG daily and Atorvastatin 20 MG. Satisfied with current treatment? yes Duration of hypertension: chronic BP monitoring frequency: not checking BP range: 120-130/80 range at dentist BP medication side effects: no Duration of hyperlipidemia: chronic Cholesterol medication side effects: no Cholesterol supplements: none Medication compliance: good compliance Aspirin: no Recent stressors: no Recurrent headaches: no Visual changes: no Palpitations: no Dyspnea: no Chest pain: no Lower extremity edema: no Dizzy/lightheaded: no   ANXIETY/STRESS Has experienced some anxiety since losing her father.  Once took Xanax, and has taken some of her  sister's Xanax, inquired into use of this.  Pt made aware of risks of psychoactive medication use to include increased sedation, respiratory suppression, falls, extrapyramidal movements,  dependence and cardiovascular events.  Will trial Buspar and therapy. Duration:uncontrolled Anxious mood: yes  Excessive worrying: no Irritability: yes  Sweating: no Nausea: no Palpitations:no Hyperventilation: no Panic attacks: no Agoraphobia: no  Obscessions/compulsions: no Depressed mood: yes Depression screen [facility] 2/9 11/30/2018 11/09/2017 11/05/2016 10/03/2015  Decreased Interest 0 0 0 0  Down, Depressed, Hopeless 0 0 0 0  PHQ - 2 Score 0 0 0 0  Altered sleeping 0 - 1 -  Tired, decreased energy 0 - 1 -  Change in appetite 0 - 0 -  Feeling bad or failure about yourself  0 - 0 -  Trouble concentrating 0 - 0 -  Moving slowly or fidgety/restless 0 - 0 -  Suicidal thoughts 0 - 0 -  PHQ-9 Score 0 - 2 -  Difficult doing work/chores Not difficult at all - - -   Anhedonia: no Weight changes: no Insomnia: none Hypersomnia: no Fatigue/loss of energy: no Feelings of worthlessness: no Feelings of guilt: no Impaired concentration/indecisiveness: yes Suicidal ideations: no  Crying spells: no Recent Stressors/Life Changes: yes   Relationship problems: no   Family stress: no     Financial stress: no    Job stress: no    Recent death/loss: yes GAD 7 : Generalized Anxiety Score 06/14/2019  Nervous, Anxious, on Edge 2  Control/stop worrying 2  Worry too much - different things 1  Trouble relaxing 1  Restless 0  Easily annoyed or irritable 2  Afraid - awful  might happen 0  Total GAD 7 Score 8  Anxiety Difficulty Somewhat difficult    Relevant past medical, surgical, family and social history reviewed and updated as indicated. Interim medical history since our last visit reviewed. Allergies and medications reviewed and updated.  Review of Systems  Constitutional: Negative for activity change,  appetite change, diaphoresis, fatigue and fever.  Respiratory: Negative for cough, chest tightness, shortness of breath and wheezing.   Cardiovascular: Negative for chest pain, palpitations and leg swelling.  Gastrointestinal: Negative.   Neurological: Negative.   Psychiatric/Behavioral: Positive for decreased concentration. Negative for self-injury, sleep disturbance and suicidal ideas. The patient is nervous/anxious.     Per HPI unless specifically indicated above     Objective:    LMP  (LMP Unknown)   Wt Readings from Last 3 Encounters:  11/30/18 198 lb 3.2 oz (89.9 kg)  06/23/18 194 lb (88 kg)  06/22/18 192 lb (87.1 kg)    Physical Exam Vitals and nursing note reviewed.  Constitutional:      General: She is awake. She is not in acute distress.    Appearance: She is well-developed. She is not ill-appearing.  HENT:     Head: Normocephalic.     Right Ear: Hearing normal.     Left Ear: Hearing normal.  Eyes:     General: Lids are normal.        Right eye: No discharge.        Left eye: No discharge.     Conjunctiva/sclera: Conjunctivae normal.  Pulmonary:     Effort: Pulmonary effort is normal. No accessory muscle usage or respiratory distress.  Musculoskeletal:     Cervical back: Normal range of motion.  Neurological:     Mental Status: She is alert and oriented to person, place, and time.  Psychiatric:        Attention and Perception: Attention normal.        Mood and Affect: Mood normal.        Behavior: Behavior normal. Behavior is cooperative.        Thought Content: Thought content normal.        Judgment: Judgment normal.    Results for orders placed or performed in visit on 03/07/19  Novel Coronavirus, NAA ([facility])   Specimen: Oropharyngeal(OP) collection in vial transport medium   OROPHARYNGEA  TESTING  Result Value Ref Range   SARS-CoV-2, NAA Not Detected Not Detected  Specimen status report  Result Value Ref Range   specimen status report Comment        Assessment & Plan:   Problem List Items Addressed This Visit      Cardiovascular and Mediastinum   Hypertension - Primary    Chronic, stable.  BP below goal on recent readings.  Continue current medication regimen and adjust as needed.  Labs outpatient.  Recommend she check BP at home a few days a week.      Relevant Orders   Comprehensive metabolic panel     Other   Hyperlipidemia    Chronic, ongoing.  Continue current medication regimen and adjust as needed.  Labs outpatient.      Relevant Orders   Comprehensive metabolic panel   Lipid Panel w/o Chol/HDL Ratio out   Grieving    Acute after loss father.  Discussed risks/benefits of benzo use with anxiety.  Will trial Buspar, script sent.  Referral for talk therapy placed. Denies SI/HI.  Return in 4 weeks.      Relevant Orders  Ambulatory referral to Psychology      I discussed the assessment and treatment plan with the patient. The patient was provided an opportunity to ask questions and all were answered. The patient agreed with the plan and demonstrated an understanding of the instructions.   The patient was advised to call back or seek an in-person evaluation if the symptoms worsen or if the condition fails to improve as anticipated.   I provided 15 minutes of time during this encounter.  Follow up plan: Return in about 4 weeks (around 07/12/2019) for Mood follow-up.

## 2019-06-15 ENCOUNTER — Encounter: Payer: Self-pay | Admitting: Nurse Practitioner

## 2019-06-15 NOTE — Progress Notes (Signed)
LVM and letter sent through [place] and printed to mail.

## 2019-06-21 ENCOUNTER — Other Ambulatory Visit: Payer: 59

## 2019-06-21 ENCOUNTER — Other Ambulatory Visit: Payer: Self-pay

## 2019-06-21 DIAGNOSIS — I10 Essential (primary) hypertension: Secondary | ICD-10-CM

## 2019-06-21 DIAGNOSIS — E782 Mixed hyperlipidemia: Secondary | ICD-10-CM

## 2019-06-22 LAB — COMPREHENSIVE METABOLIC PANEL
ALT: 29 IU/L (ref 0–32)
AST: 21 IU/L (ref 0–40)
Albumin/Globulin Ratio: 1.9 (ref 1.2–2.2)
Albumin: 4.2 g/dL (ref 3.8–4.9)
Alkaline Phosphatase: 91 IU/L (ref 39–117)
BUN/Creatinine Ratio: 22 (ref 9–23)
BUN: 16 mg/dL (ref 6–24)
Bilirubin Total: 0.3 mg/dL (ref 0.0–1.2)
CO2: 25 mmol/L (ref 20–29)
Calcium: 9.3 mg/dL (ref 8.7–10.2)
Chloride: 103 mmol/L (ref 96–106)
Creatinine, Ser: 0.74 mg/dL (ref 0.57–1.00)
GFR calc Af Amer: 103 mL/min/{1.73_m2} (ref >59)
GFR calc non Af Amer: 89 mL/min/{1.73_m2} (ref >59)
Globulin, Total: 2.2 g/dL (ref 1.5–4.5)
Glucose: 96 mg/dL (ref 65–99)
Potassium: 4.5 mmol/L (ref 3.5–5.2)
Sodium: 139 mmol/L (ref 134–144)
Total Protein: 6.4 g/dL (ref 6.0–8.5)

## 2019-06-22 LAB — LIPID PANEL W/O CHOL/HDL RATIO
Cholesterol, Total: 136 mg/dL (ref 100–199)
HDL: 40 mg/dL (ref >39)
LDL Chol Calc (NIH): 78 mg/dL (ref 0–99)
Triglycerides: 96 mg/dL (ref 0–149)
VLDL Cholesterol Cal: 18 mg/dL (ref 5–40)

## 2019-07-05 ENCOUNTER — Encounter: Payer: Self-pay | Admitting: Nurse Practitioner

## 2019-07-07 NOTE — Telephone Encounter (Signed)
Lvm for pt to call back to schedule appt.

## 2019-07-07 NOTE — Telephone Encounter (Signed)
-----   Message from Venita Lick, NP sent at 06/14/2019  9:24 AM EST ----- 4 weeks mood follow-up

## 2019-08-12 ENCOUNTER — Other Ambulatory Visit: Payer: Self-pay | Admitting: Nurse Practitioner

## 2019-08-12 DIAGNOSIS — Z1231 Encounter for screening mammogram for malignant neoplasm of breast: Secondary | ICD-10-CM

## 2019-08-15 ENCOUNTER — Ambulatory Visit (INDEPENDENT_AMBULATORY_CARE_PROVIDER_SITE_OTHER): Payer: 59 | Admitting: Psychology

## 2019-08-15 DIAGNOSIS — F4323 Adjustment disorder with mixed anxiety and depressed mood: Secondary | ICD-10-CM | POA: Diagnosis not present

## 2019-08-16 ENCOUNTER — Other Ambulatory Visit: Payer: Self-pay

## 2019-08-16 ENCOUNTER — Ambulatory Visit
Admission: RE | Admit: 2019-08-16 | Discharge: 2019-08-16 | Disposition: A | Discharge status: Home / Self Care | Payer: 59 | Source: Ambulatory Visit | Attending: Nurse Practitioner | Admitting: Nurse Practitioner

## 2019-08-16 DIAGNOSIS — Z1231 Encounter for screening mammogram for malignant neoplasm of breast: Secondary | ICD-10-CM | POA: Diagnosis present

## 2019-08-22 ENCOUNTER — Ambulatory Visit (INDEPENDENT_AMBULATORY_CARE_PROVIDER_SITE_OTHER): Payer: 59 | Admitting: Psychology

## 2019-08-22 DIAGNOSIS — F4323 Adjustment disorder with mixed anxiety and depressed mood: Secondary | ICD-10-CM | POA: Diagnosis not present

## 2019-08-29 ENCOUNTER — Ambulatory Visit: Payer: 59 | Admitting: Psychology

## 2019-08-31 ENCOUNTER — Ambulatory Visit (INDEPENDENT_AMBULATORY_CARE_PROVIDER_SITE_OTHER): Payer: 59 | Admitting: Psychology

## 2019-08-31 DIAGNOSIS — F4323 Adjustment disorder with mixed anxiety and depressed mood: Secondary | ICD-10-CM

## 2019-09-08 ENCOUNTER — Ambulatory Visit (INDEPENDENT_AMBULATORY_CARE_PROVIDER_SITE_OTHER): Payer: 59 | Admitting: Psychology

## 2019-09-08 DIAGNOSIS — F4323 Adjustment disorder with mixed anxiety and depressed mood: Secondary | ICD-10-CM | POA: Diagnosis not present

## 2019-09-30 ENCOUNTER — Other Ambulatory Visit: Payer: Self-pay | Admitting: Nurse Practitioner

## 2019-09-30 NOTE — Telephone Encounter (Signed)
Routing to provider  

## 2019-09-30 NOTE — Telephone Encounter (Signed)
Requested medication (s) are due for refill today -expired Rx  Requested medication (s) are on the active medication list -yes  Future visit scheduled -no  Last refill: 09/05/18  Notes to clinic: Request for expired Rx  Requested Prescriptions  Pending Prescriptions Disp Refills   clobetasol ointment (TEMOVATE) 0.05 % [Pharmacy Med Name: CLOBETASOL  0.05%  OIN] 90 g     Sig: [place]      Dermatology:  Corticosteroids Passed - 09/30/2019 10:46 AM      Passed - Valid encounter within last 12 months    Recent Outpatient Visits           3 months ago Essential hypertension   [place], Henrine Screws T, NP   6 months ago Upper respiratory tract infection, unspecified type   [facility] [place], Jeannette How, MD   10 months ago Encounter for annual physical exam   [place], Henrine Screws T, NP   1 year ago Essential hypertension   [place], Henrine Screws T, NP   1 year ago Annual physical exam   [facility] Kathrine Haddock, NP                  Requested Prescriptions  Pending Prescriptions Disp Refills   clobetasol ointment (TEMOVATE) 0.05 % [Pharmacy Med Name: CLOBETASOL  0.05%  OIN] 90 g     Sig: APPLY TOPICALLY TWICE DAILY      Dermatology:  Corticosteroids Passed - 09/30/2019 10:46 AM      Passed - Valid encounter within last 12 months    Recent Outpatient Visits           3 months ago Essential hypertension   [place], Henrine Screws T, NP   6 months ago Upper respiratory tract infection, unspecified type   [facility] [place], Jeannette How, MD   10 months ago Encounter for annual physical exam   [place], Henrine Screws T, NP   1 year ago Essential hypertension   [place], Henrine Screws T, NP   1 year ago Annual physical exam   [facility] Kathrine Haddock, NP

## 2019-10-11 ENCOUNTER — Ambulatory Visit (INDEPENDENT_AMBULATORY_CARE_PROVIDER_SITE_OTHER): Payer: 59 | Admitting: Psychology

## 2019-10-11 DIAGNOSIS — F4323 Adjustment disorder with mixed anxiety and depressed mood: Secondary | ICD-10-CM | POA: Diagnosis not present

## 2019-11-25 ENCOUNTER — Other Ambulatory Visit: Payer: Self-pay | Admitting: Unknown Physician Specialty

## 2019-11-29 ENCOUNTER — Ambulatory Visit (INDEPENDENT_AMBULATORY_CARE_PROVIDER_SITE_OTHER): Payer: 59 | Admitting: Psychology

## 2019-11-29 DIAGNOSIS — F4323 Adjustment disorder with mixed anxiety and depressed mood: Secondary | ICD-10-CM

## 2019-12-23 ENCOUNTER — Other Ambulatory Visit: Payer: Self-pay | Admitting: Nurse Practitioner

## 2020-01-09 ENCOUNTER — Ambulatory Visit (INDEPENDENT_AMBULATORY_CARE_PROVIDER_SITE_OTHER): Payer: 59 | Admitting: Psychology

## 2020-01-09 DIAGNOSIS — F4323 Adjustment disorder with mixed anxiety and depressed mood: Secondary | ICD-10-CM

## 2020-03-01 ENCOUNTER — Ambulatory Visit (INDEPENDENT_AMBULATORY_CARE_PROVIDER_SITE_OTHER): Payer: 59 | Admitting: Psychology

## 2020-03-01 DIAGNOSIS — F4323 Adjustment disorder with mixed anxiety and depressed mood: Secondary | ICD-10-CM

## 2020-04-18 ENCOUNTER — Ambulatory Visit (INDEPENDENT_AMBULATORY_CARE_PROVIDER_SITE_OTHER): Payer: 59 | Admitting: Psychology

## 2020-04-18 DIAGNOSIS — F4323 Adjustment disorder with mixed anxiety and depressed mood: Secondary | ICD-10-CM

## 2020-04-23 ENCOUNTER — Other Ambulatory Visit: Payer: Self-pay | Admitting: Nurse Practitioner

## 2020-04-23 NOTE — Telephone Encounter (Signed)
Requested Prescriptions  Pending Prescriptions Disp Refills  . mometasone (NASONEX) 50 MCG/ACT nasal spray [Pharmacy Med Name: MOMETASONE  50MCG  SPRAY  FUR AC] 51 g 2    Sig: USE 2 SPRAYS NASALLY DAILY     Ear, Nose, and Throat: Nasal Preparations - Corticosteroids Passed - 04/23/2020  1:58 PM      Passed - Valid encounter within last 12 months    Recent Outpatient Visits          10 months ago Essential hypertension   [place], Henrine Screws T, NP   1 year ago Upper respiratory tract infection, unspecified type   [facility] [place], Jeannette How, MD   1 year ago Encounter for annual physical exam   [place], Henrine Screws T, NP   1 year ago Essential hypertension   [place], Henrine Screws T, NP   2 years ago Annual physical exam   [facility] Kathrine Haddock, NP

## 2020-05-09 ENCOUNTER — Other Ambulatory Visit: Payer: Self-pay | Admitting: Nurse Practitioner

## 2020-06-07 ENCOUNTER — Ambulatory Visit (INDEPENDENT_AMBULATORY_CARE_PROVIDER_SITE_OTHER): Payer: 59 | Admitting: Psychology

## 2020-06-07 DIAGNOSIS — F4323 Adjustment disorder with mixed anxiety and depressed mood: Secondary | ICD-10-CM

## 2020-06-16 ENCOUNTER — Other Ambulatory Visit: Payer: 59

## 2020-07-05 ENCOUNTER — Ambulatory Visit (INDEPENDENT_AMBULATORY_CARE_PROVIDER_SITE_OTHER): Payer: 59 | Admitting: Psychology

## 2020-07-05 DIAGNOSIS — F4323 Adjustment disorder with mixed anxiety and depressed mood: Secondary | ICD-10-CM

## 2020-07-31 ENCOUNTER — Other Ambulatory Visit: Payer: Self-pay | Admitting: Nurse Practitioner

## 2020-07-31 NOTE — Telephone Encounter (Signed)
Requested medications are due for refill today.  yes  Requested medications are on the active medications list.  yes Last refill. 05/09/2020  Future visit scheduled.   no  Notes to clinic.  It appears that this pt is seeing a different PCP.  Please advise

## 2020-08-02 ENCOUNTER — Other Ambulatory Visit: Payer: Self-pay | Admitting: Nurse Practitioner

## 2020-08-02 NOTE — Telephone Encounter (Signed)
   Notes to clinic No PCP listed however pt has seen [facility] NP in January. Need PCP to approve rx.

## 2020-08-02 NOTE — Telephone Encounter (Signed)
  Notes to clinic No PCP listed however pt has seen [facility] NP in January. Need PCP to approve rx.

## 2020-08-03 NOTE — Telephone Encounter (Signed)
No longer our patient

## 2020-08-16 ENCOUNTER — Ambulatory Visit (INDEPENDENT_AMBULATORY_CARE_PROVIDER_SITE_OTHER): Payer: 59 | Admitting: Psychology

## 2020-08-16 ENCOUNTER — Other Ambulatory Visit: Payer: Self-pay | Admitting: Nurse Practitioner

## 2020-08-16 DIAGNOSIS — F4323 Adjustment disorder with mixed anxiety and depressed mood: Secondary | ICD-10-CM | POA: Diagnosis not present

## 2020-08-17 ENCOUNTER — Other Ambulatory Visit: Payer: Self-pay | Admitting: Nurse Practitioner

## 2020-08-18 ENCOUNTER — Other Ambulatory Visit: Payer: Self-pay | Admitting: Nurse Practitioner

## 2020-08-19 NOTE — Telephone Encounter (Signed)
MyChart message sent to pt to clarify if she is no longer with Lorraine Melendez. Pt reminded is she is, she is overdue for office visit/ blood work.

## 2020-08-20 NOTE — Telephone Encounter (Signed)
Pt is no longer our pt

## 2021-08-11 ENCOUNTER — Ambulatory Visit
Admission: EM | Admit: 2021-08-11 | Discharge: 2021-08-11 | Disposition: A | Discharge status: Home / Self Care | Payer: 59 | Attending: Internal Medicine | Admitting: Internal Medicine

## 2021-08-11 ENCOUNTER — Other Ambulatory Visit: Payer: Self-pay

## 2021-08-11 DIAGNOSIS — J209 Acute bronchitis, unspecified: Secondary | ICD-10-CM

## 2021-08-11 MED ORDER — ALBUTEROL SULFATE (2.5 MG/3ML) 0.083% IN NEBU
2.5000 mg | INHALATION_SOLUTION | Freq: Once | RESPIRATORY_TRACT | Status: AC
  Administered 2021-08-11: 2.5 mg via RESPIRATORY_TRACT

## 2021-08-11 MED ORDER — ALBUTEROL SULFATE HFA 108 (90 BASE) MCG/ACT IN AERS: 2.0000 | INHALATION_SPRAY | RESPIRATORY_TRACT | 0 refills | Status: AC | PRN

## 2021-08-11 MED ORDER — AZITHROMYCIN 250 MG PO TABS: ORAL_TABLET | ORAL | 0 refills | Status: AC

## 2021-08-11 MED ORDER — GUAIFENESIN-CODEINE 100-10 MG/5ML PO SOLN: 5.0000 mL | Freq: Every evening | ORAL | 0 refills | Status: AC | PRN

## 2021-08-11 NOTE — ED Triage Notes (Signed)
Pt c/o sore throat, nasal and chest congestion x8days ? ?Pt believes she has a sinus infection.  ?

## 2021-08-11 NOTE — ED Provider Notes (Signed)
?[place] ? ? ? ?CSN: [id number] ?Arrival date & time: 08/11/21  1132 ? ? ?  ? ?History   ?Chief Complaint ?Chief Complaint  ?Patient presents with  ? Nasal Congestion  ? Sore Throat  ? ? ?HPI ?Lorraine Melendez is a 61 y.o. female who presents ST nose and chest congestion x 8 days. In the past 2 days the cough has been getting worse. Seems to get worse when she lays down at night time.  ?Has been having coughing fits. Her cough is non productive. Has had night sweats the past couple of nights.  ? ? ? ?Past Medical History:  ?Diagnosis Date  ? Anxiety   ? Hyperlipidemia   ? Hypertension   ? ? ?Patient Active Problem List  ? Diagnosis Date Noted  ? Grieving 06/14/2019  ? Obesity (BMI 30-39.9) 11/09/2017  ? Allergic rhinitis 05/11/2017  ? Lichen sclerosus of female genitalia 07/11/2016  ? Impaired fasting glucose 12/05/2014  ? Hyperlipidemia 12/05/2014  ? Hypertension 12/05/2014  ? ? ?Past Surgical History:  ?Procedure Laterality Date  ? COLONOSCOPY WITH PROPOFOL N/A 12/23/2016  ? Procedure: COLONOSCOPY WITH PROPOFOL;  Surgeon: Jonathon Bellows, MD;  Location: [facility] ENDOSCOPY;  Service: Endoscopy;  Laterality: N/A;  ? TUBAL LIGATION    ? ? ?OB History   ?No obstetric history on file. ?  ? ? ? ?Home Medications   ? ?Prior to Admission medications   ?Medication Sig Start Date End Date Taking? Authorizing Provider  ?albuterol (VENTOLIN HFA) 108 (90 Base) MCG/ACT inhaler Inhale 2 puffs into the lungs every 4 (four) hours as needed for wheezing or shortness of breath. For 5 days then prn 08/11/21  Yes Rodriguez-Southworth, Sunday Spillers, PA-C  ?atorvastatin (LIPITOR) 20 MG tablet TAKE 1 TABLET BY MOUTH  DAILY AT 6PM 05/09/20  Yes Cannady, Jolene T, NP  ?azithromycin (ZITHROMAX Z-PAK) 250 MG tablet 2 today, then one qd x 4 days 08/11/21  Yes Rodriguez-Southworth, Sunday Spillers, PA-C  ?clobetasol ointment (TEMOVATE) 0.05 % APPLY TOPICALLY TWICE DAILY 09/30/19  Yes Cannady, Jolene T, NP  ?guaiFENesin-codeine 100-10 MG/5ML syrup Take 5 mLs  by mouth at bedtime as needed for cough. 08/11/21  Yes Rodriguez-Southworth, Sunday Spillers, PA-C  ?losartan (COZAAR) 50 MG tablet TAKE 1 TABLET BY MOUTH  DAILY 12/23/19  Yes Cannady, Jolene T, NP  ?mometasone (NASONEX) 50 MCG/ACT nasal spray USE 2 SPRAYS NASALLY DAILY 04/23/20  Yes Venita Lick, NP  ? ? ?Family History ?Family History  ?Problem Relation Age of Onset  ? Hypertension Mother   ? Cancer Mother   ?     breast and uterine  ? Breast cancer Mother 60  ? Hyperlipidemia Mother   ? Hyperlipidemia Father   ? Cancer Father   ?     lung  ? Hypertension Brother   ? Hyperlipidemia Brother   ? Cancer Maternal Grandmother   ?     breast and skin  ? Breast cancer Maternal Grandmother 60  ? Hyperlipidemia Brother   ? Hypertension Brother   ? ? ?Social History ?Social History  ? ?Tobacco Use  ? Smoking status: Former  ? Smokeless tobacco: Never  ?Vaping Use  ? Vaping Use: Never used  ?Substance Use Topics  ? Alcohol use: Yes  ?  Comment: occasional   ? Drug use: No  ? ? ? ?Allergies   ?Patient has no known allergies. ? ? ?Review of Systems ?Review of Systems  ?Constitutional:  Negative for appetite change, chills, diaphoresis and fever.  ?HENT:  Positive for postnasal drip. Negative for congestion, ear discharge, ear pain and sore throat.   ?     Lower throat hurts to cough  ?Eyes:  Negative for discharge.  ?Respiratory:  Positive for cough. Negative for chest tightness and shortness of breath.   ?Cardiovascular:  Negative for chest pain.  ?Musculoskeletal:  Negative for myalgias.  ?Skin:  Negative for rash.  ?Neurological:  Negative for headaches.  ?Hematological:  Negative for adenopathy.  ? ? ?Physical Exam ?Triage Vital Signs ?ED Triage Vitals  ?Enc Vitals Group  ?   BP 08/11/21 1207 127/73  ?   Pulse Rate 08/11/21 1207 76  ?   Resp 08/11/21 1207 18  ?   Temp 08/11/21 1207 98.3 ?F (36.8 ?C)  ?   Temp Source 08/11/21 1207 Oral  ?   SpO2 08/11/21 1207 98 %  ?   Weight 08/11/21 1206 188 lb (85.3 kg)  ?   Height 08/11/21  1206 '5\' 3"'$  (1.6 m)  ?   Head Circumference --   ?   Peak Flow --   ?   Pain Score 08/11/21 1206 0  ?   Pain Loc --   ?   Pain Edu? --   ?   Excl. in [place]? --   ? ?No data found. ? ?Updated Vital Signs ?BP 127/73 (BP Location: Left Arm)   Pulse 76   Temp 98.3 ?F (36.8 ?C) (Oral)   Resp 18   Ht '5\' 3"'$  (1.6 m)   Wt 188 lb (85.3 kg)   LMP  (LMP Unknown)   SpO2 98%   BMI 33.30 kg/m?  ? ?Visual Acuity ?Right Eye Distance:   ?Left Eye Distance:   ?Bilateral Distance:   ? ?Right Eye Near:   ?Left Eye Near:    ?Bilateral Near:    ? ? ?Physical Exam ?Constitutional:   ?   General: Lorraine Melendez is not in acute distress. ?   Appearance: Lorraine Melendez is not toxic-appearing.  ?HENT:  ?   Head: Normocephalic.  ?   Right Ear: Tympanic membrane, ear canal and external ear normal.  ?   Left Ear: Ear canal and external ear normal.  ?   Nose: Nose normal.  ?   Mouth/Throat:  ?   Mouth: Mucous membranes are moist.  ?   Pharynx: Oropharynx is clear.  ?Eyes:  ?   General: No scleral icterus. ?   Conjunctiva/sclera: Conjunctivae normal.  ?Cardiovascular:  ?   Rate and Rhythm: Normal rate and regular rhythm.  ?   Heart sounds: No murmur heard.  ? ?Pulmonary:  ?   Effort: Pulmonary effort is normal. No respiratory distress. Deep breaths provoked cough attacks, but this improved after the Albuterol neb treatment.  ?   Breath sounds: Wheezing present at end of exhalation which improved after neb treatment.   ?   Comments: has a wheezy cough ?Musculoskeletal:     ?   General: Normal range of motion.  ?   Cervical back: Neck supple.  ?Lymphadenopathy:  ?   Cervical: No cervical adenopathy.  ?Skin: ?   General: Skin is warm and dry.  ?   Findings: No rash.  ?Neurological:  ?   Mental Status: Lorraine Melendez is alert and oriented to person, place, and time.  ?   Gait: Gait normal.  ?Psychiatric:     ?   Mood and Affect: Mood normal.     ?   Behavior: Behavior normal.     ?  Thought Content: Thought content normal.     ?   Judgment: Judgment normal.  ? ? ?UC Treatments /  Results  ?Labs ?(all labs ordered are listed, but only abnormal results are displayed) ?Labs Reviewed - No data to display ? ?EKG ? ? ?Radiology ?No results found. ? ?Procedures ?Procedures (including critical care time) ? ?Medications Ordered in UC ?Medications  ?albuterol (PROVENTIL) (2.5 MG/3ML) 0.083% nebulizer solution 2.5 mg (2.5 mg Nebulization Given 08/11/21 1300)  ? ? ?Initial Impression / Assessment and Plan / UC Course  ?I have reviewed the triage vital signs and the nursing notes. ?Acute bronchitis ?I placed her on Albuterol inhaler and Zpack.  ?Final Clinical Impressions(s) / UC Diagnoses  ? ?Final diagnoses:  ?Acute bronchitis, unspecified organism  ? ?Discharge Instructions   ?None ?  ? ?ED Prescriptions   ? ? Medication Sig Dispense Auth. Provider  ? albuterol (VENTOLIN HFA) 108 (90 Base) MCG/ACT inhaler Inhale 2 puffs into the lungs every 4 (four) hours as needed for wheezing or shortness of breath. For 5 days then prn 18 g Rodriguez-Southworth, Sunday Spillers, PA-C  ? azithromycin (ZITHROMAX Z-PAK) 250 MG tablet 2 today, then one qd x 4 days 6 tablet Rodriguez-Southworth, Yeraldine Forney, PA-C  ? guaiFENesin-codeine 100-10 MG/5ML syrup Take 5 mLs by mouth at bedtime as needed for cough. 120 mL Rodriguez-Southworth, Sunday Spillers, PA-C  ? ?  ? ?I have reviewed the PDMP during this encounter. ?  ?Shelby Mattocks, PA-C ?08/11/21 1337 ? ?
